# Patient Record
Sex: Male | Born: 1935 | Race: White | Hispanic: No | Marital: Married | Smoking: Former smoker
Health system: Southern US, Community
[De-identification: ages and names within clinical notes are randomized; demographics above are authoritative.]

## PROBLEM LIST (undated history)

## (undated) DIAGNOSIS — I2510 Atherosclerotic heart disease of native coronary artery without angina pectoris: Secondary | ICD-10-CM

## (undated) DIAGNOSIS — I714 Abdominal aortic aneurysm, without rupture, unspecified: Secondary | ICD-10-CM

## (undated) DIAGNOSIS — R002 Palpitations: Secondary | ICD-10-CM

## (undated) DIAGNOSIS — R011 Cardiac murmur, unspecified: Secondary | ICD-10-CM

## (undated) DIAGNOSIS — I509 Heart failure, unspecified: Secondary | ICD-10-CM

## (undated) DIAGNOSIS — IMO0001 Reserved for inherently not codable concepts without codable children: Secondary | ICD-10-CM

## (undated) DIAGNOSIS — R062 Wheezing: Secondary | ICD-10-CM

## (undated) DIAGNOSIS — E785 Hyperlipidemia, unspecified: Secondary | ICD-10-CM

## (undated) DIAGNOSIS — I219 Acute myocardial infarction, unspecified: Secondary | ICD-10-CM

## (undated) DIAGNOSIS — M1990 Unspecified osteoarthritis, unspecified site: Secondary | ICD-10-CM

## (undated) DIAGNOSIS — H539 Unspecified visual disturbance: Secondary | ICD-10-CM

## (undated) DIAGNOSIS — R05 Cough: Secondary | ICD-10-CM

## (undated) DIAGNOSIS — R531 Weakness: Secondary | ICD-10-CM

## (undated) DIAGNOSIS — R1310 Dysphagia, unspecified: Secondary | ICD-10-CM

## (undated) DIAGNOSIS — I480 Paroxysmal atrial fibrillation: Secondary | ICD-10-CM

## (undated) DIAGNOSIS — M7989 Other specified soft tissue disorders: Secondary | ICD-10-CM

## (undated) DIAGNOSIS — Z5189 Encounter for other specified aftercare: Secondary | ICD-10-CM

## (undated) DIAGNOSIS — J189 Pneumonia, unspecified organism: Secondary | ICD-10-CM

## (undated) DIAGNOSIS — K219 Gastro-esophageal reflux disease without esophagitis: Secondary | ICD-10-CM

## (undated) DIAGNOSIS — K529 Noninfective gastroenteritis and colitis, unspecified: Secondary | ICD-10-CM

## (undated) DIAGNOSIS — I951 Orthostatic hypotension: Secondary | ICD-10-CM

## (undated) DIAGNOSIS — I10 Essential (primary) hypertension: Secondary | ICD-10-CM

## (undated) DIAGNOSIS — H3530 Unspecified macular degeneration: Secondary | ICD-10-CM

## (undated) DIAGNOSIS — R059 Cough, unspecified: Secondary | ICD-10-CM

## (undated) DIAGNOSIS — R197 Diarrhea, unspecified: Secondary | ICD-10-CM

## (undated) DIAGNOSIS — R0981 Nasal congestion: Secondary | ICD-10-CM

## (undated) DIAGNOSIS — R001 Bradycardia, unspecified: Secondary | ICD-10-CM

## (undated) DIAGNOSIS — J449 Chronic obstructive pulmonary disease, unspecified: Secondary | ICD-10-CM

## (undated) HISTORY — DX: Reserved for inherently not codable concepts without codable children: IMO0001

## (undated) HISTORY — DX: Dysphagia, unspecified: R13.10

## (undated) HISTORY — DX: Bradycardia, unspecified: R00.1

## (undated) HISTORY — DX: Orthostatic hypotension: I95.1

## (undated) HISTORY — DX: Paroxysmal atrial fibrillation: I48.0

## (undated) HISTORY — DX: Wheezing: R06.2

## (undated) HISTORY — DX: Unspecified visual disturbance: H53.9

## (undated) HISTORY — DX: Acute myocardial infarction, unspecified: I21.9

## (undated) HISTORY — DX: Abdominal aortic aneurysm, without rupture: I71.4

## (undated) HISTORY — DX: Encounter for other specified aftercare: Z51.89

## (undated) HISTORY — DX: Nasal congestion: R09.81

## (undated) HISTORY — DX: Chronic obstructive pulmonary disease, unspecified: J44.9

## (undated) HISTORY — DX: Other specified soft tissue disorders: M79.89

## (undated) HISTORY — DX: Cough: R05

## (undated) HISTORY — DX: Hyperlipidemia, unspecified: E78.5

## (undated) HISTORY — PX: BACK SURGERY: SHX140

## (undated) HISTORY — DX: Essential (primary) hypertension: I10

## (undated) HISTORY — DX: Atherosclerotic heart disease of native coronary artery without angina pectoris: I25.10

## (undated) HISTORY — PX: CATARACT EXTRACTION W/ INTRAOCULAR LENS  IMPLANT, BILATERAL: SHX1307

## (undated) HISTORY — PX: INSERT / REPLACE / REMOVE PACEMAKER: SUR710

## (undated) HISTORY — DX: Palpitations: R00.2

## (undated) HISTORY — DX: Gastro-esophageal reflux disease without esophagitis: K21.9

## (undated) HISTORY — PX: INGUINAL HERNIA REPAIR: SUR1180

## (undated) HISTORY — DX: Heart failure, unspecified: I50.9

## (undated) HISTORY — PX: FIXATION KYPHOPLASTY LUMBAR SPINE: SHX1642

## (undated) HISTORY — DX: Diarrhea, unspecified: R19.7

## (undated) HISTORY — DX: Unspecified macular degeneration: H35.30

## (undated) HISTORY — DX: Unspecified osteoarthritis, unspecified site: M19.90

## (undated) HISTORY — DX: Abdominal aortic aneurysm, without rupture, unspecified: I71.40

## (undated) HISTORY — DX: Cough, unspecified: R05.9

## (undated) HISTORY — DX: Weakness: R53.1

---

## 1987-11-14 DIAGNOSIS — I219 Acute myocardial infarction, unspecified: Secondary | ICD-10-CM

## 1987-11-14 HISTORY — DX: Acute myocardial infarction, unspecified: I21.9

## 1994-11-13 HISTORY — PX: CORONARY ARTERY BYPASS GRAFT: SHX141

## 2000-12-14 HISTORY — PX: PACEMAKER INSERTION: SHX728

## 2004-09-19 ENCOUNTER — Ambulatory Visit: Payer: Self-pay | Admitting: Cardiology

## 2004-10-10 ENCOUNTER — Ambulatory Visit: Payer: Self-pay | Admitting: Internal Medicine

## 2004-10-12 ENCOUNTER — Ambulatory Visit: Payer: Self-pay

## 2004-10-31 ENCOUNTER — Ambulatory Visit: Payer: Self-pay | Admitting: Cardiovascular Disease

## 2004-11-10 ENCOUNTER — Ambulatory Visit: Payer: Self-pay | Admitting: Cardiovascular Disease

## 2004-11-24 ENCOUNTER — Ambulatory Visit: Payer: Self-pay | Admitting: Internal Medicine

## 2004-12-15 ENCOUNTER — Ambulatory Visit: Payer: Self-pay | Admitting: Cardiology

## 2004-12-16 ENCOUNTER — Ambulatory Visit: Payer: Self-pay | Admitting: Internal Medicine

## 2004-12-29 ENCOUNTER — Ambulatory Visit: Payer: Self-pay | Admitting: *Deleted

## 2005-01-17 ENCOUNTER — Ambulatory Visit: Payer: Self-pay | Admitting: Internal Medicine

## 2005-01-26 ENCOUNTER — Ambulatory Visit: Payer: Self-pay | Admitting: Cardiology

## 2005-02-20 ENCOUNTER — Ambulatory Visit: Payer: Self-pay | Admitting: Internal Medicine

## 2005-02-23 ENCOUNTER — Ambulatory Visit: Payer: Self-pay | Admitting: Cardiology

## 2005-03-23 ENCOUNTER — Ambulatory Visit: Payer: Self-pay | Admitting: Internal Medicine

## 2005-03-24 ENCOUNTER — Ambulatory Visit: Payer: Self-pay | Admitting: Internal Medicine

## 2005-04-19 ENCOUNTER — Ambulatory Visit: Payer: Self-pay | Admitting: Cardiology

## 2005-04-28 ENCOUNTER — Ambulatory Visit: Payer: Self-pay | Admitting: Internal Medicine

## 2005-05-19 ENCOUNTER — Ambulatory Visit: Payer: Self-pay | Admitting: Cardiology

## 2005-06-15 ENCOUNTER — Ambulatory Visit: Payer: Self-pay | Admitting: Internal Medicine

## 2005-06-28 ENCOUNTER — Ambulatory Visit: Payer: Self-pay | Admitting: Cardiology

## 2005-06-28 ENCOUNTER — Encounter: Admission: RE | Admit: 2005-06-28 | Discharge: 2005-06-28 | Payer: Self-pay | Admitting: Orthopedic Surgery

## 2005-07-06 ENCOUNTER — Ambulatory Visit: Payer: Self-pay | Admitting: Cardiology

## 2005-07-20 ENCOUNTER — Ambulatory Visit: Payer: Self-pay | Admitting: Cardiology

## 2005-07-23 ENCOUNTER — Emergency Department (HOSPITAL_COMMUNITY): Admission: EM | Admit: 2005-07-23 | Discharge: 2005-07-23 | Payer: Self-pay | Admitting: Emergency Medicine

## 2005-08-03 ENCOUNTER — Ambulatory Visit: Payer: Self-pay | Admitting: Cardiology

## 2005-08-24 ENCOUNTER — Ambulatory Visit: Payer: Self-pay | Admitting: Cardiology

## 2005-09-14 ENCOUNTER — Ambulatory Visit: Payer: Self-pay | Admitting: Cardiology

## 2005-09-28 ENCOUNTER — Ambulatory Visit: Payer: Self-pay | Admitting: Cardiology

## 2005-09-29 ENCOUNTER — Ambulatory Visit: Payer: Self-pay | Admitting: Internal Medicine

## 2005-10-10 ENCOUNTER — Ambulatory Visit: Payer: Self-pay

## 2005-10-13 ENCOUNTER — Ambulatory Visit: Payer: Self-pay | Admitting: Cardiology

## 2005-10-26 ENCOUNTER — Ambulatory Visit: Payer: Self-pay | Admitting: *Deleted

## 2005-11-01 ENCOUNTER — Ambulatory Visit: Payer: Self-pay | Admitting: Internal Medicine

## 2005-11-09 ENCOUNTER — Ambulatory Visit: Payer: Self-pay | Admitting: *Deleted

## 2005-12-05 ENCOUNTER — Ambulatory Visit: Payer: Self-pay | Admitting: Internal Medicine

## 2005-12-07 ENCOUNTER — Ambulatory Visit: Payer: Self-pay | Admitting: Cardiology

## 2005-12-22 ENCOUNTER — Ambulatory Visit: Payer: Self-pay | Admitting: Internal Medicine

## 2006-01-04 ENCOUNTER — Ambulatory Visit: Payer: Self-pay | Admitting: Cardiology

## 2006-01-10 ENCOUNTER — Ambulatory Visit: Payer: Self-pay | Admitting: Internal Medicine

## 2006-01-18 ENCOUNTER — Ambulatory Visit: Payer: Self-pay | Admitting: Cardiology

## 2006-01-31 ENCOUNTER — Ambulatory Visit: Payer: Self-pay | Admitting: Cardiology

## 2006-02-09 ENCOUNTER — Ambulatory Visit: Payer: Self-pay | Admitting: Internal Medicine

## 2006-02-21 ENCOUNTER — Ambulatory Visit: Payer: Self-pay | Admitting: *Deleted

## 2006-03-21 ENCOUNTER — Ambulatory Visit: Payer: Self-pay | Admitting: Cardiology

## 2006-03-21 ENCOUNTER — Ambulatory Visit: Payer: Self-pay | Admitting: Internal Medicine

## 2006-04-13 ENCOUNTER — Ambulatory Visit: Payer: Self-pay | Admitting: Cardiology

## 2006-05-15 ENCOUNTER — Ambulatory Visit: Payer: Self-pay | Admitting: Cardiology

## 2006-05-15 ENCOUNTER — Ambulatory Visit: Payer: Self-pay | Admitting: Internal Medicine

## 2006-05-22 ENCOUNTER — Ambulatory Visit (HOSPITAL_COMMUNITY): Admission: RE | Admit: 2006-05-22 | Discharge: 2006-05-22 | Payer: Self-pay | Admitting: Surgery

## 2006-05-29 ENCOUNTER — Ambulatory Visit: Payer: Self-pay | Admitting: Cardiology

## 2006-06-12 ENCOUNTER — Ambulatory Visit: Payer: Self-pay | Admitting: Cardiology

## 2006-06-26 ENCOUNTER — Ambulatory Visit: Payer: Self-pay | Admitting: Cardiology

## 2006-07-11 ENCOUNTER — Ambulatory Visit: Payer: Self-pay | Admitting: Internal Medicine

## 2006-07-17 ENCOUNTER — Ambulatory Visit: Payer: Self-pay | Admitting: Cardiovascular Disease

## 2006-08-08 ENCOUNTER — Ambulatory Visit: Payer: Self-pay | Admitting: Internal Medicine

## 2006-08-14 ENCOUNTER — Ambulatory Visit: Payer: Self-pay | Admitting: *Deleted

## 2006-08-28 ENCOUNTER — Ambulatory Visit: Payer: Self-pay | Admitting: Cardiology

## 2006-09-10 ENCOUNTER — Ambulatory Visit: Payer: Self-pay | Admitting: Internal Medicine

## 2006-09-18 ENCOUNTER — Ambulatory Visit: Payer: Self-pay | Admitting: *Deleted

## 2006-10-02 ENCOUNTER — Ambulatory Visit: Payer: Self-pay | Admitting: Cardiovascular Disease

## 2006-10-05 ENCOUNTER — Ambulatory Visit: Payer: Self-pay | Admitting: Internal Medicine

## 2006-10-16 ENCOUNTER — Ambulatory Visit: Payer: Self-pay | Admitting: Cardiology

## 2006-10-26 ENCOUNTER — Ambulatory Visit: Payer: Self-pay | Admitting: Internal Medicine

## 2006-10-26 ENCOUNTER — Ambulatory Visit: Payer: Self-pay | Admitting: Cardiology

## 2006-11-09 ENCOUNTER — Ambulatory Visit: Payer: Self-pay | Admitting: Cardiology

## 2006-11-30 ENCOUNTER — Ambulatory Visit: Payer: Self-pay | Admitting: Internal Medicine

## 2006-12-07 ENCOUNTER — Ambulatory Visit: Payer: Self-pay | Admitting: Internal Medicine

## 2006-12-21 ENCOUNTER — Ambulatory Visit: Payer: Self-pay | Admitting: *Deleted

## 2006-12-28 ENCOUNTER — Ambulatory Visit: Payer: Self-pay | Admitting: Internal Medicine

## 2007-01-16 ENCOUNTER — Ambulatory Visit: Payer: Self-pay | Admitting: Critical Care Medicine

## 2007-01-18 ENCOUNTER — Ambulatory Visit: Payer: Self-pay | Admitting: Cardiology

## 2007-01-25 ENCOUNTER — Ambulatory Visit: Payer: Self-pay | Admitting: Internal Medicine

## 2007-01-28 ENCOUNTER — Ambulatory Visit: Payer: Self-pay | Admitting: Internal Medicine

## 2007-02-06 ENCOUNTER — Ambulatory Visit: Payer: Self-pay | Admitting: Cardiovascular Disease

## 2007-02-22 ENCOUNTER — Ambulatory Visit: Payer: Self-pay | Admitting: Internal Medicine

## 2007-03-01 ENCOUNTER — Ambulatory Visit: Payer: Self-pay | Admitting: Cardiology

## 2007-03-05 ENCOUNTER — Ambulatory Visit: Payer: Self-pay | Admitting: Critical Care Medicine

## 2007-03-22 ENCOUNTER — Ambulatory Visit: Payer: Self-pay | Admitting: Internal Medicine

## 2007-03-29 ENCOUNTER — Ambulatory Visit: Payer: Self-pay | Admitting: Cardiology

## 2007-04-12 ENCOUNTER — Ambulatory Visit: Payer: Self-pay | Admitting: Cardiology

## 2007-04-24 ENCOUNTER — Ambulatory Visit: Payer: Self-pay | Admitting: Internal Medicine

## 2007-05-01 ENCOUNTER — Ambulatory Visit: Payer: Self-pay | Admitting: Cardiology

## 2007-05-08 ENCOUNTER — Ambulatory Visit: Payer: Self-pay | Admitting: Cardiology

## 2007-05-24 ENCOUNTER — Ambulatory Visit: Payer: Self-pay | Admitting: Internal Medicine

## 2007-05-29 ENCOUNTER — Ambulatory Visit: Payer: Self-pay | Admitting: Internal Medicine

## 2007-06-19 ENCOUNTER — Ambulatory Visit: Payer: Self-pay | Admitting: Cardiology

## 2007-06-21 ENCOUNTER — Ambulatory Visit: Payer: Self-pay | Admitting: Internal Medicine

## 2007-07-12 ENCOUNTER — Ambulatory Visit: Payer: Self-pay | Admitting: Internal Medicine

## 2007-07-17 ENCOUNTER — Ambulatory Visit: Payer: Self-pay | Admitting: Internal Medicine

## 2007-07-31 ENCOUNTER — Ambulatory Visit: Payer: Self-pay | Admitting: Cardiology

## 2007-08-09 ENCOUNTER — Ambulatory Visit: Payer: Self-pay | Admitting: Internal Medicine

## 2007-08-16 ENCOUNTER — Ambulatory Visit: Payer: Self-pay | Admitting: Internal Medicine

## 2007-09-06 ENCOUNTER — Ambulatory Visit: Payer: Self-pay | Admitting: Internal Medicine

## 2007-09-13 ENCOUNTER — Ambulatory Visit: Payer: Self-pay | Admitting: Cardiology

## 2007-09-27 ENCOUNTER — Ambulatory Visit: Payer: Self-pay | Admitting: Cardiology

## 2007-10-04 ENCOUNTER — Ambulatory Visit: Payer: Self-pay | Admitting: Internal Medicine

## 2007-10-14 ENCOUNTER — Ambulatory Visit: Payer: Self-pay | Admitting: Cardiology

## 2007-10-15 ENCOUNTER — Ambulatory Visit: Payer: Self-pay | Admitting: Cardiology

## 2007-10-31 ENCOUNTER — Ambulatory Visit: Payer: Self-pay | Admitting: Cardiology

## 2007-11-01 ENCOUNTER — Ambulatory Visit: Payer: Self-pay | Admitting: Internal Medicine

## 2007-11-15 ENCOUNTER — Ambulatory Visit: Payer: Self-pay | Admitting: Cardiology

## 2007-11-26 ENCOUNTER — Ambulatory Visit: Payer: Self-pay | Admitting: Cardiovascular Disease

## 2007-11-29 ENCOUNTER — Ambulatory Visit: Payer: Self-pay | Admitting: Internal Medicine

## 2007-12-10 ENCOUNTER — Ambulatory Visit: Payer: Self-pay | Admitting: Cardiovascular Disease

## 2007-12-19 ENCOUNTER — Inpatient Hospital Stay (HOSPITAL_COMMUNITY): Admission: EM | Admit: 2007-12-19 | Discharge: 2007-12-21 | Payer: Self-pay | Admitting: Emergency Medicine

## 2007-12-25 ENCOUNTER — Ambulatory Visit: Payer: Self-pay | Admitting: Cardiology

## 2008-01-06 ENCOUNTER — Ambulatory Visit: Payer: Self-pay | Admitting: Cardiology

## 2008-02-03 ENCOUNTER — Ambulatory Visit: Payer: Self-pay | Admitting: Cardiology

## 2008-02-10 ENCOUNTER — Ambulatory Visit: Payer: Self-pay | Admitting: Cardiology

## 2008-02-25 ENCOUNTER — Ambulatory Visit: Payer: Self-pay | Admitting: Cardiology

## 2008-02-28 ENCOUNTER — Ambulatory Visit: Payer: Self-pay | Admitting: Internal Medicine

## 2008-03-09 ENCOUNTER — Ambulatory Visit: Payer: Self-pay | Admitting: Cardiovascular Disease

## 2008-03-18 ENCOUNTER — Encounter: Admission: RE | Admit: 2008-03-18 | Discharge: 2008-03-18 | Payer: Self-pay | Admitting: Geriatric Medicine

## 2008-03-23 ENCOUNTER — Ambulatory Visit: Payer: Self-pay | Admitting: Cardiology

## 2008-04-14 ENCOUNTER — Ambulatory Visit: Payer: Self-pay | Admitting: Cardiovascular Disease

## 2008-04-15 ENCOUNTER — Ambulatory Visit: Payer: Self-pay | Admitting: Cardiology

## 2008-04-28 ENCOUNTER — Ambulatory Visit: Payer: Self-pay | Admitting: Cardiology

## 2008-05-21 ENCOUNTER — Ambulatory Visit: Payer: Self-pay | Admitting: Cardiology

## 2008-05-25 ENCOUNTER — Telehealth (INDEPENDENT_AMBULATORY_CARE_PROVIDER_SITE_OTHER): Payer: Self-pay | Admitting: *Deleted

## 2008-05-26 ENCOUNTER — Ambulatory Visit: Payer: Self-pay | Admitting: Internal Medicine

## 2008-05-26 DIAGNOSIS — J45909 Unspecified asthma, uncomplicated: Secondary | ICD-10-CM | POA: Insufficient documentation

## 2008-05-26 DIAGNOSIS — J40 Bronchitis, not specified as acute or chronic: Secondary | ICD-10-CM | POA: Insufficient documentation

## 2008-05-27 DIAGNOSIS — E785 Hyperlipidemia, unspecified: Secondary | ICD-10-CM | POA: Insufficient documentation

## 2008-05-27 DIAGNOSIS — I951 Orthostatic hypotension: Secondary | ICD-10-CM

## 2008-05-27 DIAGNOSIS — I4891 Unspecified atrial fibrillation: Secondary | ICD-10-CM

## 2008-05-27 DIAGNOSIS — I2510 Atherosclerotic heart disease of native coronary artery without angina pectoris: Secondary | ICD-10-CM | POA: Insufficient documentation

## 2008-05-29 ENCOUNTER — Ambulatory Visit: Payer: Self-pay | Admitting: Internal Medicine

## 2008-06-03 ENCOUNTER — Ambulatory Visit: Payer: Self-pay | Admitting: Cardiology

## 2008-07-01 ENCOUNTER — Ambulatory Visit: Payer: Self-pay | Admitting: Cardiology

## 2008-07-07 ENCOUNTER — Encounter: Admission: RE | Admit: 2008-07-07 | Discharge: 2008-10-05 | Payer: Self-pay | Admitting: Ophthalmology

## 2008-07-29 ENCOUNTER — Ambulatory Visit: Payer: Self-pay | Admitting: Cardiovascular Disease

## 2008-08-26 ENCOUNTER — Ambulatory Visit: Payer: Self-pay | Admitting: Cardiology

## 2008-08-28 ENCOUNTER — Ambulatory Visit: Payer: Self-pay | Admitting: Internal Medicine

## 2008-09-16 ENCOUNTER — Ambulatory Visit: Payer: Self-pay | Admitting: Cardiovascular Disease

## 2008-09-23 ENCOUNTER — Ambulatory Visit: Payer: Self-pay | Admitting: Internal Medicine

## 2008-10-23 ENCOUNTER — Encounter: Admission: RE | Admit: 2008-10-23 | Discharge: 2008-10-23 | Payer: Self-pay | Admitting: Geriatric Medicine

## 2008-10-28 ENCOUNTER — Ambulatory Visit: Payer: Self-pay

## 2008-10-28 ENCOUNTER — Ambulatory Visit: Payer: Self-pay | Admitting: Cardiology

## 2008-11-18 ENCOUNTER — Encounter: Admission: RE | Admit: 2008-11-18 | Discharge: 2008-11-18 | Payer: Self-pay | Admitting: Geriatric Medicine

## 2008-11-25 ENCOUNTER — Ambulatory Visit: Payer: Self-pay | Admitting: Cardiology

## 2008-11-27 ENCOUNTER — Ambulatory Visit: Payer: Self-pay | Admitting: Internal Medicine

## 2008-12-23 ENCOUNTER — Ambulatory Visit: Payer: Self-pay | Admitting: Cardiology

## 2009-01-22 DIAGNOSIS — L408 Other psoriasis: Secondary | ICD-10-CM

## 2009-01-22 DIAGNOSIS — K219 Gastro-esophageal reflux disease without esophagitis: Secondary | ICD-10-CM

## 2009-01-22 DIAGNOSIS — H3530 Unspecified macular degeneration: Secondary | ICD-10-CM | POA: Insufficient documentation

## 2009-01-22 DIAGNOSIS — Z87898 Personal history of other specified conditions: Secondary | ICD-10-CM | POA: Insufficient documentation

## 2009-02-09 ENCOUNTER — Ambulatory Visit: Payer: Self-pay | Admitting: Cardiology

## 2009-02-15 ENCOUNTER — Encounter: Admission: RE | Admit: 2009-02-15 | Discharge: 2009-02-15 | Payer: Self-pay | Admitting: Geriatric Medicine

## 2009-02-19 ENCOUNTER — Encounter (INDEPENDENT_AMBULATORY_CARE_PROVIDER_SITE_OTHER): Payer: Self-pay

## 2009-02-27 ENCOUNTER — Emergency Department (HOSPITAL_COMMUNITY): Admission: EM | Admit: 2009-02-27 | Discharge: 2009-02-27 | Payer: Self-pay | Admitting: Emergency Medicine

## 2009-03-01 ENCOUNTER — Ambulatory Visit: Payer: Self-pay | Admitting: Internal Medicine

## 2009-03-03 ENCOUNTER — Ambulatory Visit: Payer: Self-pay | Admitting: Internal Medicine

## 2009-03-09 ENCOUNTER — Encounter: Admission: RE | Admit: 2009-03-09 | Discharge: 2009-03-09 | Payer: Self-pay | Admitting: Geriatric Medicine

## 2009-03-09 ENCOUNTER — Ambulatory Visit (HOSPITAL_COMMUNITY): Admission: RE | Admit: 2009-03-09 | Discharge: 2009-03-09 | Payer: Self-pay | Admitting: Geriatric Medicine

## 2009-03-24 ENCOUNTER — Ambulatory Visit: Payer: Self-pay | Admitting: Cardiology

## 2009-04-07 ENCOUNTER — Telehealth (INDEPENDENT_AMBULATORY_CARE_PROVIDER_SITE_OTHER): Payer: Self-pay | Admitting: *Deleted

## 2009-04-07 ENCOUNTER — Ambulatory Visit: Payer: Self-pay | Admitting: Internal Medicine

## 2009-04-13 ENCOUNTER — Telehealth: Payer: Self-pay | Admitting: Cardiology

## 2009-04-14 ENCOUNTER — Encounter: Payer: Self-pay | Admitting: *Deleted

## 2009-04-16 ENCOUNTER — Encounter: Admission: RE | Admit: 2009-04-16 | Discharge: 2009-04-16 | Payer: Self-pay | Admitting: Geriatric Medicine

## 2009-04-19 ENCOUNTER — Ambulatory Visit: Payer: Self-pay | Admitting: Cardiology

## 2009-04-19 DIAGNOSIS — Z950 Presence of cardiac pacemaker: Secondary | ICD-10-CM

## 2009-04-28 ENCOUNTER — Ambulatory Visit: Payer: Self-pay | Admitting: Cardiology

## 2009-04-28 LAB — CONVERTED CEMR LAB: Protime: 15.6

## 2009-05-19 ENCOUNTER — Ambulatory Visit: Payer: Self-pay | Admitting: Internal Medicine

## 2009-05-19 ENCOUNTER — Encounter: Payer: Self-pay | Admitting: *Deleted

## 2009-05-19 ENCOUNTER — Encounter (INDEPENDENT_AMBULATORY_CARE_PROVIDER_SITE_OTHER): Payer: Self-pay | Admitting: Cardiology

## 2009-05-19 LAB — CONVERTED CEMR LAB: POC INR: 1.8

## 2009-05-31 ENCOUNTER — Ambulatory Visit: Payer: Self-pay | Admitting: Internal Medicine

## 2009-06-09 ENCOUNTER — Ambulatory Visit: Payer: Self-pay | Admitting: Cardiology

## 2009-06-09 LAB — CONVERTED CEMR LAB
POC INR: 2.7
Prothrombin Time: 19.8 s

## 2009-06-30 ENCOUNTER — Ambulatory Visit: Payer: Self-pay | Admitting: Cardiology

## 2009-06-30 LAB — CONVERTED CEMR LAB: POC INR: 2.5

## 2009-07-21 ENCOUNTER — Ambulatory Visit: Payer: Self-pay | Admitting: Cardiology

## 2009-07-21 LAB — CONVERTED CEMR LAB: POC INR: 4.4

## 2009-08-03 ENCOUNTER — Telehealth: Payer: Self-pay | Admitting: Cardiology

## 2009-08-04 ENCOUNTER — Ambulatory Visit: Payer: Self-pay | Admitting: Internal Medicine

## 2009-08-04 ENCOUNTER — Encounter (INDEPENDENT_AMBULATORY_CARE_PROVIDER_SITE_OTHER): Payer: Self-pay | Admitting: *Deleted

## 2009-08-04 LAB — CONVERTED CEMR LAB: POC INR: 2.8

## 2009-08-19 ENCOUNTER — Encounter: Payer: Self-pay | Admitting: Cardiology

## 2009-08-23 ENCOUNTER — Encounter: Payer: Self-pay | Admitting: Cardiology

## 2009-08-24 ENCOUNTER — Ambulatory Visit: Payer: Self-pay | Admitting: Cardiology

## 2009-08-24 DIAGNOSIS — R609 Edema, unspecified: Secondary | ICD-10-CM

## 2009-08-26 ENCOUNTER — Ambulatory Visit: Payer: Self-pay | Admitting: Cardiology

## 2009-08-30 ENCOUNTER — Ambulatory Visit: Payer: Self-pay | Admitting: Internal Medicine

## 2009-08-31 ENCOUNTER — Encounter: Payer: Self-pay | Admitting: Cardiology

## 2009-08-31 ENCOUNTER — Ambulatory Visit: Payer: Self-pay | Admitting: Cardiology

## 2009-09-07 ENCOUNTER — Ambulatory Visit (HOSPITAL_COMMUNITY): Admission: RE | Admit: 2009-09-07 | Discharge: 2009-09-07 | Payer: Self-pay | Admitting: Internal Medicine

## 2009-09-07 ENCOUNTER — Encounter: Payer: Self-pay | Admitting: Cardiology

## 2009-09-07 ENCOUNTER — Ambulatory Visit: Payer: Self-pay

## 2009-09-07 ENCOUNTER — Ambulatory Visit: Payer: Self-pay | Admitting: Cardiology

## 2009-09-15 ENCOUNTER — Ambulatory Visit: Payer: Self-pay | Admitting: Cardiology

## 2009-09-15 DIAGNOSIS — M7120 Synovial cyst of popliteal space [Baker], unspecified knee: Secondary | ICD-10-CM

## 2009-09-15 DIAGNOSIS — M79609 Pain in unspecified limb: Secondary | ICD-10-CM

## 2009-09-16 ENCOUNTER — Ambulatory Visit: Payer: Self-pay

## 2009-10-09 ENCOUNTER — Ambulatory Visit: Payer: Self-pay | Admitting: Internal Medicine

## 2009-10-12 ENCOUNTER — Encounter (INDEPENDENT_AMBULATORY_CARE_PROVIDER_SITE_OTHER): Payer: Self-pay | Admitting: Cardiology

## 2009-10-12 ENCOUNTER — Ambulatory Visit: Payer: Self-pay | Admitting: Cardiology

## 2009-10-12 LAB — CONVERTED CEMR LAB: POC INR: 2.6

## 2009-10-22 ENCOUNTER — Ambulatory Visit: Payer: Self-pay | Admitting: Cardiology

## 2009-10-22 ENCOUNTER — Encounter: Payer: Self-pay | Admitting: Internal Medicine

## 2009-11-03 ENCOUNTER — Telehealth (INDEPENDENT_AMBULATORY_CARE_PROVIDER_SITE_OTHER): Payer: Self-pay | Admitting: *Deleted

## 2009-11-09 ENCOUNTER — Ambulatory Visit: Payer: Self-pay | Admitting: Cardiology

## 2009-11-09 LAB — CONVERTED CEMR LAB: POC INR: 3.1

## 2009-11-15 ENCOUNTER — Telehealth: Payer: Self-pay | Admitting: Cardiology

## 2009-11-30 ENCOUNTER — Ambulatory Visit: Payer: Self-pay | Admitting: Cardiology

## 2009-12-14 ENCOUNTER — Ambulatory Visit: Payer: Self-pay | Admitting: Internal Medicine

## 2009-12-31 ENCOUNTER — Ambulatory Visit: Payer: Self-pay | Admitting: Internal Medicine

## 2010-01-04 ENCOUNTER — Encounter (INDEPENDENT_AMBULATORY_CARE_PROVIDER_SITE_OTHER): Payer: Self-pay | Admitting: Cardiology

## 2010-01-04 ENCOUNTER — Ambulatory Visit: Payer: Self-pay | Admitting: Cardiology

## 2010-02-01 ENCOUNTER — Ambulatory Visit: Payer: Self-pay | Admitting: Cardiovascular Disease

## 2010-02-22 ENCOUNTER — Ambulatory Visit: Payer: Self-pay | Admitting: Cardiovascular Disease

## 2010-03-22 ENCOUNTER — Ambulatory Visit: Payer: Self-pay | Admitting: Cardiovascular Disease

## 2010-03-31 ENCOUNTER — Encounter: Payer: Self-pay | Admitting: Cardiology

## 2010-04-01 ENCOUNTER — Ambulatory Visit: Payer: Self-pay | Admitting: Internal Medicine

## 2010-04-15 ENCOUNTER — Ambulatory Visit: Payer: Self-pay | Admitting: Internal Medicine

## 2010-04-15 ENCOUNTER — Encounter: Payer: Self-pay | Admitting: Cardiology

## 2010-04-19 ENCOUNTER — Ambulatory Visit: Payer: Self-pay | Admitting: Cardiology

## 2010-04-19 LAB — CONVERTED CEMR LAB: POC INR: 2.1

## 2010-04-21 ENCOUNTER — Encounter: Admission: RE | Admit: 2010-04-21 | Discharge: 2010-04-21 | Payer: Self-pay | Admitting: Dermatology

## 2010-04-26 ENCOUNTER — Ambulatory Visit: Payer: Self-pay | Admitting: Cardiology

## 2010-05-20 ENCOUNTER — Ambulatory Visit: Payer: Self-pay | Admitting: Cardiology

## 2010-05-20 LAB — CONVERTED CEMR LAB: POC INR: 2.5

## 2010-06-24 ENCOUNTER — Ambulatory Visit: Payer: Self-pay | Admitting: Internal Medicine

## 2010-06-24 LAB — CONVERTED CEMR LAB: POC INR: 3.1

## 2010-07-01 ENCOUNTER — Ambulatory Visit: Payer: Self-pay | Admitting: Internal Medicine

## 2010-07-15 ENCOUNTER — Ambulatory Visit: Payer: Self-pay | Admitting: Cardiology

## 2010-07-15 LAB — CONVERTED CEMR LAB: POC INR: 2.8

## 2010-07-19 ENCOUNTER — Telehealth: Payer: Self-pay | Admitting: Cardiology

## 2010-08-03 ENCOUNTER — Encounter: Admission: RE | Admit: 2010-08-03 | Discharge: 2010-08-03 | Payer: Self-pay | Admitting: Gastroenterology

## 2010-08-05 ENCOUNTER — Ambulatory Visit: Payer: Self-pay | Admitting: Internal Medicine

## 2010-08-05 LAB — CONVERTED CEMR LAB: POC INR: 2.4

## 2010-08-24 ENCOUNTER — Telehealth: Payer: Self-pay | Admitting: Cardiology

## 2010-09-02 ENCOUNTER — Ambulatory Visit: Payer: Self-pay | Admitting: Internal Medicine

## 2010-09-09 ENCOUNTER — Encounter (INDEPENDENT_AMBULATORY_CARE_PROVIDER_SITE_OTHER): Payer: Self-pay | Admitting: *Deleted

## 2010-09-19 ENCOUNTER — Encounter: Payer: Self-pay | Admitting: Internal Medicine

## 2010-09-19 ENCOUNTER — Inpatient Hospital Stay (HOSPITAL_COMMUNITY): Admission: EM | Admit: 2010-09-19 | Discharge: 2010-09-23 | Payer: Self-pay | Admitting: Emergency Medicine

## 2010-09-19 DIAGNOSIS — R6520 Severe sepsis without septic shock: Secondary | ICD-10-CM

## 2010-09-19 DIAGNOSIS — R1319 Other dysphagia: Secondary | ICD-10-CM | POA: Insufficient documentation

## 2010-09-19 DIAGNOSIS — R0902 Hypoxemia: Secondary | ICD-10-CM

## 2010-09-19 DIAGNOSIS — A419 Sepsis, unspecified organism: Secondary | ICD-10-CM | POA: Insufficient documentation

## 2010-09-19 DIAGNOSIS — J189 Pneumonia, unspecified organism: Secondary | ICD-10-CM

## 2010-09-26 ENCOUNTER — Ambulatory Visit: Payer: Self-pay | Admitting: Internal Medicine

## 2010-09-30 ENCOUNTER — Ambulatory Visit: Payer: Self-pay | Admitting: Internal Medicine

## 2010-10-13 ENCOUNTER — Ambulatory Visit: Payer: Self-pay | Admitting: Cardiovascular Disease

## 2010-10-13 ENCOUNTER — Ambulatory Visit: Payer: Self-pay | Admitting: Internal Medicine

## 2010-10-25 ENCOUNTER — Telehealth: Payer: Self-pay | Admitting: Cardiology

## 2010-10-28 ENCOUNTER — Encounter: Payer: Self-pay | Admitting: Cardiology

## 2010-10-28 ENCOUNTER — Ambulatory Visit: Payer: Self-pay | Admitting: Cardiology

## 2010-11-10 ENCOUNTER — Ambulatory Visit: Payer: Self-pay | Admitting: Cardiology

## 2010-11-10 LAB — CONVERTED CEMR LAB: POC INR: 2.3

## 2010-11-13 HISTORY — PX: ABDOMINAL AORTIC ANEURYSM REPAIR: SUR1152

## 2010-11-17 ENCOUNTER — Encounter: Payer: Self-pay | Admitting: Cardiology

## 2010-11-24 ENCOUNTER — Encounter: Payer: Self-pay | Admitting: Cardiology

## 2010-12-04 ENCOUNTER — Encounter: Payer: Self-pay | Admitting: Orthopedic Surgery

## 2010-12-05 ENCOUNTER — Ambulatory Visit (HOSPITAL_COMMUNITY)
Admission: RE | Admit: 2010-12-05 | Discharge: 2010-12-05 | Payer: Self-pay | Source: Home / Self Care | Attending: Gastroenterology | Admitting: Gastroenterology

## 2010-12-09 NOTE — Op Note (Addendum)
NAMEALDRICK, Clayton Gordon                ACCOUNT NO.:  [id number]  MEDICAL RECORD NO.:  [id number]          PATIENT TYPE:  AMB  LOCATION:  ENDO                         FACILITY:  [facility]  PHYSICIAN:  Petra Kuba, M.D.    DATE OF BIRTH:  01-24-1915  DATE OF PROCEDURE:  12/05/2010 DATE OF DISCHARGE:                              OPERATIVE REPORT   PROCEDURE:  Colonoscopy with biopsy.  INDICATION:  Patient with diarrhea and incontinence as well as iron deficiency.  Consent was signed after risks, benefits, methods and options were thoroughly discussed multiple times in the past with both the patient and his wife.  MEDICINES USED:  Fentanyl 50 mcg, Versed 3 mg.  PROCEDURE IN DETAIL:  Rectal inspection was pertinent for minimal external hemorrhoids.  Digital exam was negative.  The pediatric video colonoscope was inserted and with surprising ease advanced around the colon to the cecum.  This did require abdominal pressure one time for looping, but no other abnormalities.  There were no signs of colitis or bleeding on insertion.  A tiny mid ascending polyp was seen and was not biopsied.  The cecum was identified by the appendiceal orifice and the ileocecal valve.  In fact, the scope was inserted a short ways in the terminal ileum, which was normal.  Photo documentation was obtained. The scope was slowly withdrawn.  On slow withdrawal through the colon, a few random biopsies were obtained to rule out microscopic colitis. Other than the tiny polyp in the ascending, which was not biopsied and not worrisome looking, no other abnormalities were seen as we slowly withdrew to the distal sigmoid where another tiny polyp was seen and photo documentation was obtained, but this was not biopsied.  The random colon biopsies were put in the first container.  The prep was fairly adequate.  There was some liquid stool that required washing and suctioning but no gross obvious lesions were seen.  Once back  in the rectum, a large villous-appearing sessile rectal polyp, which seemed to extend right to the anorectal junction was seen.  It was flat. Retroflexion confirmed the findings.  It would be difficult to remove endoscopically based on its position and it was not really affecting the lumen, although it did occupy about a third of the colon.  It still was fairly flat.  We elected to take multiple biopsies, which were done. The scope was reinserted a short ways up the left side of the colon, air was suctioned, scope removed.  The patient tolerated the procedure well. There was no obvious immediate complication.  ENDOSCOPIC DIAGNOSES: 1. Tiny internal-external hemorrhoids. 2. Two tiny sigmoid and ascending polyps, not biopsied. 3. Large villous distal rectal sessile polyp status post biopsy     occupying about a third of the lumen but not particularly     encroaching on it. 4. Otherwise within normal limits to the terminal ileum status post a     few random colon biopsies to rule out microscopic colitis.  PLAN:  We will await pathology, see how he does with his bowels with this very good cleaning and otherwise follow up in  2 weeks to discuss the options on this polyp and call me sooner p.r.n.  We will resume Coumadin tomorrow if no delayed complications.          ______________________________ Petra Kuba, M.D.     MEM/MEDQ  D:  12/05/2010  T:  12/05/2010  Job:  [id number]  cc:   Petra Kuba, M.D. Fax: [phone]  Hal T. Stoneking, M.D. Fax: [phone]  Electronically Signed by Vida Rigger M.D. on 12/09/2010 08:25:21 AM

## 2010-12-11 LAB — CONVERTED CEMR LAB
BUN: 16 mg/dL (ref 6–23)
CO2: 32 meq/L (ref 19–32)
Calcium: 9.1 mg/dL (ref 8.4–10.5)
Chloride: 102 meq/L (ref 96–112)
Creatinine, Ser: 1.2 mg/dL (ref 0.4–1.5)
Glucose, Bld: 98 mg/dL (ref 70–99)

## 2010-12-13 ENCOUNTER — Encounter: Payer: Self-pay | Admitting: Cardiology

## 2010-12-13 ENCOUNTER — Encounter
Admission: RE | Admit: 2010-12-13 | Discharge: 2010-12-13 | Payer: Self-pay | Source: Home / Self Care | Attending: Gastroenterology | Admitting: Gastroenterology

## 2010-12-13 NOTE — Medication Information (Signed)
Summary: rov.km  Anticoagulant Therapy  Managed by: Cloyde Reams, RN, BSN Referring MD: Valera Castle MD PCP: Merlene Laughter, MD Supervising MD: Eden Emms MD, Theron Arista Indication 1: Atrial Fibrillation (ICD-427.31) Indication 2: Coronary Artery Disease (ICD-CAD) Lab Used: [facility] [place] Site: [address] INR POC 1.6 INR RANGE 2 - 2.5  Dietary changes: no    Health status changes: no    Bleeding/hemorrhagic complications: no    Recent/future hospitalizations: no    Any changes in medication regimen? no    Recent/future dental: no  Any missed doses?: no       Is patient compliant with meds? yes       Allergies (verified): No Known Drug Allergies  Anticoagulation Management History:      The patient is taking warfarin and comes in today for a routine follow up visit.  Positive risk factors for bleeding include an age of 75 years or older.  The bleeding index is 'intermediate risk'.  Positive CHADS2 values include Age > 75 years old.  The start date was 11/13/1993.  Anticoagulation responsible provider: Eden Emms MD, Theron Arista.  INR POC: 1.6.  Cuvette Lot#: [id number].  Exp: 03/2011.    Anticoagulation Management Assessment/Plan:      The patient's current anticoagulation dose is Coumadin 5 mg  tabs: take as directed per coumadin clinic.  The target INR is 2 - 2.5.  The next INR is due 02/22/2010.  Anticoagulation instructions were given to patient.  Results were reviewed/authorized by Cloyde Reams, RN, BSN.  He was notified by Cloyde Reams RN.         Prior Anticoagulation Instructions: INR 1.9  Take 1.5 tabs today only, then resume 1 tab daily except 0.5 tab on Mondays, Wednesdays, Fridays.  Recheck in 4 weeks.    Current Anticoagulation Instructions: INR 1.6  Take 1.5 tablets tonight, then start taking 1 tablet daily except 1/2 tablet on Mondays and Fridays.  Recheck in 2-3 weeks.

## 2010-12-13 NOTE — Medication Information (Signed)
Summary: rov/ewj  Anticoagulant Therapy  Managed by: Cloyde Reams, RN, BSN Referring MD: Valera Castle MD PCP: Merlene Laughter, MD Supervising MD: Tenny Craw MD, Gunnar Fusi Indication 1: Atrial Fibrillation (ICD-427.31) Indication 2: Coronary Artery Disease (ICD-CAD) Lab Used: [facility] [place] Site: [address] INR POC 3.1 INR RANGE 2 - 2.5  Dietary changes: yes       Details: Decr amt of vit K intake, has been on vacation.  Health status changes: no    Bleeding/hemorrhagic complications: no    Recent/future hospitalizations: no    Any changes in medication regimen? no    Recent/future dental: no  Any missed doses?: no       Is patient compliant with meds? yes       Allergies: No Known Drug Allergies  Anticoagulation Management History:      The patient is taking warfarin and comes in today for a routine follow up visit.  Positive risk factors for bleeding include an age of 75 years or older.  The bleeding index is 'intermediate risk'.  Positive CHADS2 values include Age > 31 years old.  The start date was 11/13/1993.  Anticoagulation responsible provider: Tenny Craw MD, Gunnar Fusi.  INR POC: 3.1.  Cuvette Lot#: [id number].  Exp: 08/2011.    Anticoagulation Management Assessment/Plan:      The patient's current anticoagulation dose is Coumadin 5 mg  tabs: take as directed per coumadin clinic.  The target INR is 2 - 2.5.  The next INR is due 07/15/2010.  Anticoagulation instructions were given to patient.  Results were reviewed/authorized by Cloyde Reams, RN, BSN.  He was notified by Reina Fuse PharmD.         Prior Anticoagulation Instructions: INR 2.5  Continue on same dosage 1 tablet daily except 1/2 tablet on Wednesdays and Saturdays.  Recheck in 4 weeks.    Current Anticoagulation Instructions: INR 3.1  Take 1/2 tablet today, then start taking 1 tablet daily except 1/2 tablet on Mondays, Wednesdays, and Saturdays.  Recheck in 3 weeks.

## 2010-12-13 NOTE — Cardiovascular Report (Signed)
Summary: Office Visit   Office Visit   Imported By: Roderic Ovens 11/18/2009 14:16:41  _____________________________________________________________________  External Attachment:    Type:   Image     Comment:   External Document

## 2010-12-13 NOTE — Assessment & Plan Note (Signed)
Summary: 6 MONTH ROV/SL   Visit Type:  6 mo f/u Primary Provider:  Merlene Laughter, MD  CC:  sob and pt states he sometimes gets a little chest discomfort at times....denies any edema.  History of Present Illness: Clayton Gordon returns today for evaluation and management of his chronic atrial fibrillation, anticoagulation, and coronary artery disease.  He offers no complaint except that when he pushes himself doing yard work he gets a little short of breath and a little tightness in his chest. It goes away with rest. He also gets a little lightheaded when he stands up too quickly.  He denies any bleeding or melena. Denies orthopnea, PND or increased edema.  Current Medications (verified): 1)  Pravastatin Sodium 20 Mg Tabs (Pravastatin Sodium) .Marland Kitchen.. 1 Once Daily 2)  Proscar 5 Mg  Tabs (Finasteride) .... Take 1 Tablet By Mouth Once A Day 3)  Coumadin 5 Mg  Tabs (Warfarin Sodium) .... Take As Directed Per Coumadin Clinic 4)  Imdur 60 Mg  Xr24h-Tab (Isosorbide Mononitrate) .... Take 1 Tablet By Mouth Once A Day 5)  Coreg 25 Mg  Tabs (Carvedilol) .... Take 1 Tablet By Mouth Two Times A Day 6)  Nitroglycerin 0.4 Mg  Subl (Nitroglycerin) .Marland Kitchen.. 1 Under Tongue Ever 5 Min X3 As Needed For Chest Pain 7)  Mucinex Dm 30-600 Mg  Xr12h-Tab (Dextromethorphan-Guaifenesin) .Marland Kitchen.. 1 Tab Every 12 Hours As Needed 8)  Furosemide 40 Mg Tabs (Furosemide) .Marland Kitchen.. 1 Once Daily 9)  Aciphex 20 Mg Tbec (Rabeprazole Sodium) .Marland Kitchen.. 1 Tab Once Daily 10)  Famotidine 20 Mg Tabs (Famotidine) .Marland Kitchen.. 1 Tab Once Daily 11)  Lomotil 2.5-0.025 Mg Tabs (Diphenoxylate-Atropine) .... As Needed  Allergies (verified): No Known Drug Allergies  Review of Systems       negative history of present illness  Vital Signs:  Patient profile:   77 year old male Height:      69 inches Weight:      145 pounds Pulse rate:   68 / minute Pulse rhythm:   regular BP sitting:   110 / 70  (left arm) Cuff size:   large  Vitals Entered By: Danielle Rankin,  CMA (April 26, 2010 9:56 AM)  Physical Exam  General:  alert and oriented, in no acute distress, elderly Head:  normocephalic and atraumatic Eyes:  PERRLA/EOM intact; conjunctiva and lids normal. Neck:  Neck supple, no JVD. No masses, thyromegaly or abnormal cervical nodes. Chest [name]:  no deformities or breast masses noted Lungs:  Clear bilaterally to auscultation and percussion. Heart:  PMI nondisplaced, irregular rate and rhythm Msk:  decreased ROM.   Pulses:  diminished but present in the lower extremities Extremities:  trace left pedal edema and trace right pedal edema.   Neurologic:  Alert and oriented x 3. Skin:  Intact without lesions or rashes. Psych:  Normal affect.   PPM Specifications Following MD:  Lewayne Bunting, MD     PPM Vendor:  [employer]     PPM Model Number:  [id number]     PPM Serial Number:  [id number] PPM DOI:  01/09/2001     PPM Implanting MD:  Lewayne Bunting, MD  Lead 1    Location: [facility]     DOI: 01/09/2001     Model #: [id number]     Serial #: [id number]     Status: active Lead 2    Location: RV     DOI: 01/09/2001     Model #: [id number]     Serial #: [id number]  Status: active  Magnet Response Rate:  BOL 85 ERI 65  Indications:  AFIB   PPM Follow Up Pacer Dependent:  No      Episodes Coumadin:  Yes  Parameters Mode:  DDIR     Lower Rate Limit:  60     Upper Rate Limit:  120 Paced AV Delay:  180     Impression & Recommendations:  Problem # 1:  ATRIAL FIBRILLATION (ICD-427.31) Assessment Unchanged  His updated medication list for this problem includes:    Coumadin 5 Mg Tabs (Warfarin sodium) .Marland Kitchen... Take as directed per coumadin clinic    Coreg 25 Mg Tabs (Carvedilol) .Marland Kitchen... Take 1 tablet by mouth two times a day  Problem # 2:  COUMADIN THERAPY (ICD-V58.61) Assessment: Unchanged  Problem # 3:  CORONARY ARTERY DISEASE (ICD-414.00) Assessment: Unchanged  His updated medication list for this problem includes:    Coumadin 5 Mg Tabs (Warfarin sodium) .Marland Kitchen... Take  as directed per coumadin clinic    Imdur 60 Mg Xr24h-tab (Isosorbide mononitrate) .Marland Kitchen... Take 1 tablet by mouth once a day    Coreg 25 Mg Tabs (Carvedilol) .Marland Kitchen... Take 1 tablet by mouth two times a day    Nitroglycerin 0.4 Mg Subl (Nitroglycerin) .Marland Kitchen... 1 under tongue ever 5 min x3 as needed for chest pain  Patient Instructions: 1)  Your physician recommends that you schedule a follow-up appointment in: 6 months with Dr. Daleen Squibb 2)  Your physician recommends that you continue on your current medications as directed. Please refer to the Current Medication list given to you today.

## 2010-12-13 NOTE — Medication Information (Signed)
Summary: Clayton Gordon  Anticoagulant Therapy  Managed by: Cloyde Reams, RN, BSN Referring MD: Valera Castle MD PCP: Merlene Laughter, MD Supervising MD: Excell Seltzer MD, Casimiro Needle Indication 1: Atrial Fibrillation (ICD-427.31) Indication 2: Coronary Artery Disease (ICD-CAD) Lab Used: [facility]  Site: [address] INR POC 2.6 INR RANGE 2 - 2.5  Dietary changes: yes       Details: May have eaten sl less greens.    Health status changes: no    Bleeding/hemorrhagic complications: no    Recent/future hospitalizations: no    Any changes in medication regimen? no    Recent/future dental: no  Any missed doses?: no       Is patient compliant with meds? yes      Comments: Pt states he has been taking 5mg  once daily/2.5mg  W.  Allergies (verified): No Known Drug Allergies  Anticoagulation Management History:      The patient is taking warfarin and comes in today for a routine follow up visit.  Positive risk factors for bleeding include an age of 75 years or older.  The bleeding index is 'intermediate risk'.  Positive CHADS2 values include Age > 77 years old.  The start date was 11/13/1993.  Anticoagulation responsible provider: Excell Seltzer MD, Casimiro Needle.  INR POC: 2.6.  Cuvette Lot#: [id number].  Exp: 03/2011.    Anticoagulation Management Assessment/Plan:      The patient's current anticoagulation dose is Coumadin 5 mg  tabs: take as directed per coumadin clinic.  The target INR is 2 - 2.5.  The next INR is due 03/22/2010.  Anticoagulation instructions were given to patient.  Results were reviewed/authorized by Cloyde Reams, RN, BSN.  He was notified by Cloyde Reams RN.         Prior Anticoagulation Instructions: INR 1.6  Take 1.5 tablets tonight, then start taking 1 tablet daily except 1/2 tablet on Mondays and Fridays.  Recheck in 2-3 weeks.     Current Anticoagulation Instructions: INR 2.6  Start taking 5mg  daily except 2.5mg  on Wednesdays and Saturdays.  Recheck in 4 weeks.

## 2010-12-13 NOTE — Medication Information (Signed)
Summary: rov/jaj  Anticoagulant Therapy  Managed by: Lyna Poser, PharmD Referring MD: Valera Castle MD PCP: Merlene Laughter, MD Supervising MD: Tenny Craw MD, Gunnar Fusi Indication 1: Atrial Fibrillation (ICD-427.31) Indication 2: Coronary Artery Disease (ICD-CAD) Lab Used: [facility] [place] Site: [address] INR POC 2.4 INR RANGE 2 - 2.5  Dietary changes: no    Health status changes: no    Bleeding/hemorrhagic complications: no    Recent/future hospitalizations: no    Any changes in medication regimen? no    Recent/future dental: no  Any missed doses?: no       Is patient compliant with meds? yes       Allergies: No Known Drug Allergies  Anticoagulation Management History:      Positive risk factors for bleeding include an age of 24 years or older.  The bleeding index is 'intermediate risk'.  Positive CHADS2 values include Age > 25 years old.  The start date was 11/13/1993.  Anticoagulation responsible provider: Tenny Craw MD, Gunnar Fusi.  INR POC: 2.4.  Exp: 08/2011.    Anticoagulation Management Assessment/Plan:      The patient's current anticoagulation dose is Coumadin 5 mg  tabs: take as directed per coumadin clinic.  The target INR is 2 - 2.5.  The next INR is due 09/02/2010.  Anticoagulation instructions were given to patient.  Results were reviewed/authorized by Lyna Poser, PharmD.         Prior Anticoagulation Instructions: INR 2.8  Take 1/2 tablet today. Then continue with regular schedule of 1 tablet on Sun, Tues, Thurs, and Fri except take 1/2 tablet on Mon, Wed, and Sat.   Re-check INR in 3 weeks.    Current Anticoagulation Instructions: INR 2.4 No changes today. Continue taking 1 tablet on sunday, tuesday, thursday, and friday. Take a half tablet on monday, wednesday, and saturday. Recheck in 4 weeks.

## 2010-12-13 NOTE — Progress Notes (Signed)
Summary: refill request   Phone Note Refill Request Message from:  Patient on August 24, 2010 12:03 PM  Refills Requested: Medication #1:  FUROSEMIDE 40 MG TABS 1 once daily [facility]   Method Requested: Fax to Local Pharmacy Initial call taken by: Glynda Jaeger,  August 24, 2010 12:04 PM Caller: Patient [phone] Reason for Call: Talk to Nurse    Prescriptions: FUROSEMIDE 40 MG TABS (FUROSEMIDE) 1 once daily  #90 x 3   Entered by:   Danielle Rankin, CMA   Authorized by:   Gaylord Shih, MD, [facility]   Signed by:   Danielle Rankin, CMA on 08/24/2010   Method used:   Faxed to ...       [facility] (mail-order)             , [place]         Ph: [phone]       Fax: [phone]   RxID:   [id number]

## 2010-12-13 NOTE — Medication Information (Signed)
Summary: rov/ewj  Anticoagulant Therapy  Managed by: Weston Brass, PharmD Referring MD: Valera Castle MD PCP: Merlene Laughter, MD Supervising MD: Tenny Craw MD, Gunnar Fusi Indication 1: Atrial Fibrillation (ICD-427.31) Indication 2: Coronary Artery Disease (ICD-CAD) Lab Used: [facility] [place] Site: [address] INR POC 2.8 INR RANGE 2 - 2.5  Dietary changes: yes       Details: Less greens  Health status changes: no    Bleeding/hemorrhagic complications: no    Recent/future hospitalizations: no    Any changes in medication regimen? no    Recent/future dental: no  Any missed doses?: no       Is patient compliant with meds? yes       Allergies: No Known Drug Allergies  Anticoagulation Management History:      The patient is taking warfarin and comes in today for a routine follow up visit.  Positive risk factors for bleeding include an age of 75 years or older.  The bleeding index is 'intermediate risk'.  Positive CHADS2 values include Age > 84 years old.  The start date was 11/13/1993.  Anticoagulation responsible provider: Tenny Craw MD, Gunnar Fusi.  INR POC: 2.8.  Cuvette Lot#: [id number].  Exp: 08/2011.    Anticoagulation Management Assessment/Plan:      The patient's current anticoagulation dose is Coumadin 5 mg  tabs: take as directed per coumadin clinic.  The target INR is 2 - 2.5.  The next INR is due 08/05/2010.  Anticoagulation instructions were given to patient.  Results were reviewed/authorized by Weston Brass, PharmD.  He was notified by Harrel Carina, PharmD candidate.         Prior Anticoagulation Instructions: INR 3.1  Take 1/2 tablet today, then start taking 1 tablet daily except 1/2 tablet on Mondays, Wednesdays, and Saturdays.  Recheck in 3 weeks.    Current Anticoagulation Instructions: INR 2.8  Take 1/2 tablet today. Then continue with regular schedule of 1 tablet on Sun, Tues, Thurs, and Fri except take 1/2 tablet on Mon, Wed, and Sat.   Re-check INR in 3  weeks.

## 2010-12-13 NOTE — Medication Information (Signed)
Summary: rov/tm  Anticoagulant Therapy  Managed by: Cloyde Reams, RN, BSN Referring MD: Valera Castle MD PCP: Merlene Laughter, MD Supervising MD: Graciela Husbands MD, Viviann Spare Indication 1: Atrial Fibrillation (ICD-427.31) Indication 2: Coronary Artery Disease (ICD-CAD) Lab Used: [facility] [place] Site: [address] INR POC 1.5 INR RANGE 2 - 2.5  Dietary changes: no    Health status changes: no    Bleeding/hemorrhagic complications: no    Recent/future hospitalizations: no    Any changes in medication regimen? no    Recent/future dental: no  Any missed doses?: no       Is patient compliant with meds? yes       Allergies (verified): No Known Drug Allergies  Anticoagulation Management History:      The patient is taking warfarin and comes in today for a routine follow up visit.  Positive risk factors for bleeding include an age of 45 years or older.  The bleeding index is 'intermediate risk'.  Positive CHADS2 values include Age > 17 years old.  The start date was 11/13/1993.  Anticoagulation responsible provider: Graciela Husbands MD, Viviann Spare.  INR POC: 1.5.  Cuvette Lot#: [id number].  Exp: 02/2011.    Anticoagulation Management Assessment/Plan:      The patient's current anticoagulation dose is Coumadin 5 mg  tabs: take as directed per coumadin clinic.  The target INR is 2 - 2.5.  The next INR is due 12/28/2009.  Anticoagulation instructions were given to patient.  Results were reviewed/authorized by Cloyde Reams, RN, BSN.  He was notified by Cloyde Reams RN.         Prior Anticoagulation Instructions: INR 1.7 Today take 5mg s then resume 2.5mg s everyday except 5mg s on Mondays, Wednesdays and Fridays. Recheck in 2 weeks.   Current Anticoagulation Instructions: INR 1.5  Take 5mg  today then start taking 5mg  daily except 2.5mg  on Mondays, Wednesdays, and Fridays.  Recheck in 2 weeks.

## 2010-12-13 NOTE — Medication Information (Signed)
Summary: rov/mlw  Anticoagulant Therapy  Managed by: Bethena Midget, RN, BSN Referring MD: Valera Castle MD PCP: Merlene Laughter, MD Supervising MD: Myrtis Ser MD, Tinnie Gens Indication 1: Atrial Fibrillation (ICD-427.31) Indication 2: Coronary Artery Disease (ICD-CAD) Lab Used: [facility] [place] Site: [address] INR POC 1.7 INR RANGE 2 - 2.5  Dietary changes: no    Health status changes: no    Bleeding/hemorrhagic complications: no    Recent/future hospitalizations: no    Any changes in medication regimen? no    Recent/future dental: no  Any missed doses?: no       Is patient compliant with meds? yes       Allergies (verified): No Known Drug Allergies  Anticoagulation Management History:      The patient is taking warfarin and comes in today for a routine follow up visit.  Positive risk factors for bleeding include an age of 75 years or older.  The bleeding index is 'intermediate risk'.  Positive CHADS2 values include Age > 93 years old.  The start date was 11/13/1993.  Anticoagulation responsible provider: Myrtis Ser MD, Tinnie Gens.  INR POC: 1.7.  Cuvette Lot#: [id number].  Exp: 02/2011.    Anticoagulation Management Assessment/Plan:      The patient's current anticoagulation dose is Coumadin 5 mg  tabs: take as directed per coumadin clinic.  The target INR is 2 - 2.5.  The next INR is due 12/14/2009.  Anticoagulation instructions were given to patient.  Results were reviewed/authorized by Bethena Midget, RN, BSN.  He was notified by Bethena Midget, RN, BSN.         Prior Anticoagulation Instructions: INR 3.1  Take 0.5 tab daily (2.5 mg) except 1 tab (5 mg) on Mondays, Wednesdays, and Fridays.   Recheck in 3 weeks.   Current Anticoagulation Instructions: INR 1.7 Today take 5mg s then resume 2.5mg s everyday except 5mg s on Mondays, Wednesdays and Fridays. Recheck in 2 weeks.

## 2010-12-13 NOTE — Letter (Signed)
Summary: Handout Printed  Printed Handout:  - Coumadin Instructions-w/out Meds 

## 2010-12-13 NOTE — Cardiovascular Report (Signed)
Summary: TTM   TTM   Imported By: Roderic Ovens 07/14/2010 11:39:01  _____________________________________________________________________  External Attachment:    Type:   Image     Comment:   External Document

## 2010-12-13 NOTE — Medication Information (Signed)
Summary: rov/sp  Anticoagulant Therapy  Managed by: Weston Brass, PharmD Referring MD: Valera Castle MD PCP: Merlene Laughter, MD Supervising MD: Daleen Squibb MD, Maisie Fus Indication 1: Atrial Fibrillation (ICD-427.31) Indication 2: Coronary Artery Disease (ICD-CAD) Lab Used: [facility] [place] Site: [address] INR POC 2.1 INR RANGE 2 - 2.5  Dietary changes: no    Health status changes: no    Bleeding/hemorrhagic complications: no    Recent/future hospitalizations: no    Any changes in medication regimen? no    Recent/future dental: no  Any missed doses?: no       Is patient compliant with meds? yes       Allergies: No Known Drug Allergies  Anticoagulation Management History:      The patient is taking warfarin and comes in today for a routine follow up visit.  Positive risk factors for bleeding include an age of 75 years or older.  The bleeding index is 'intermediate risk'.  Positive CHADS2 values include Age > 49 years old.  The start date was 11/13/1993.  Anticoagulation responsible provider: Daleen Squibb MD, Maisie Fus.  INR POC: 2.1.  Cuvette Lot#: [id number].  Exp: 06/2011.    Anticoagulation Management Assessment/Plan:      The patient's current anticoagulation dose is Coumadin 5 mg  tabs: take as directed per coumadin clinic.  The target INR is 2 - 2.5.  The next INR is due 05/17/2010.  Anticoagulation instructions were given to patient.  Results were reviewed/authorized by Weston Brass, PharmD.  He was notified by Weston Brass PharmD.         Prior Anticoagulation Instructions: INR 2.1  Continue same dose of 1 tablet every day except 1/2 tablet on Wednesday and Saturday   Current Anticoagulation Instructions: INR 2.1  Continue same dose of 1 tablet every day except 1/2 tablet on Wednesday and Saturday

## 2010-12-13 NOTE — Letter (Signed)
Summary: [facility]  [facility]   Imported By: Roderic Ovens 04/21/2010 13:55:28  _____________________________________________________________________  External Attachment:    Type:   Image     Comment:   External Document

## 2010-12-13 NOTE — Medication Information (Signed)
Summary: rov/mw  Anticoagulant Therapy  Managed by: Bethena Midget, RN, BSN Referring MD: Valera Castle MD PCP: Merlene Laughter, PMD, Dr Juanito Doom - Cards, Dr. Ewing Schlein - GI Supervising MD: Eden Emms MD, Theron Arista Indication 1: Atrial Fibrillation (ICD-427.31) Indication 2: Coronary Artery Disease (ICD-CAD) Lab Used: [facility] [place] Site: [address] INR POC 2.2 INR RANGE 2 - 2.5  Dietary changes: no    Health status changes: no    Bleeding/hemorrhagic complications: no    Recent/future hospitalizations: yes       Details: Approx 3 weeks ago was in hospital for pnuemonia  Any changes in medication regimen? no    Recent/future dental: no  Any missed doses?: no       Is patient compliant with meds? yes       Allergies: No Known Drug Allergies  Anticoagulation Management History:      The patient is taking warfarin and comes in today for a routine follow up visit.  Positive risk factors for bleeding include an age of 75 years or older.  The bleeding index is 'intermediate risk'.  Positive CHADS2 values include Age > 37 years old.  The start date was 11/13/1993.  Anticoagulation responsible Shantae Vantol: Eden Emms MD, Theron Arista.  INR POC: 2.2.  Cuvette Lot#: [id number].  Exp: 10/2011.    Anticoagulation Management Assessment/Plan:      The patient's current anticoagulation dose is Coumadin 5 mg  tabs: take as directed per coumadin clinic.  The target INR is 2 - 2.5.  The next INR is due 11/10/2010.  Anticoagulation instructions were given to patient.  Results were reviewed/authorized by Bethena Midget, RN, BSN.  He was notified by Bethena Midget, RN, BSN.         Prior Anticoagulation Instructions: INR 2.4  Continue taking a half tablet on monday, wednesday, and saturday. And 1 tablet all other days. Recheck in 2 weeks.   Current Anticoagulation Instructions: INR 2.2 Continue 1 pill everyday except 1/2 pill on Mondays, Wednesdays and Saturdays. Recheck in 4 weeks.

## 2010-12-13 NOTE — Medication Information (Signed)
Summary: rov/ewj  Anticoagulant Therapy  Managed by: Weston Brass, PharmD Referring MD: Valera Castle MD PCP: Merlene Laughter, MD Supervising MD: Eden Emms MD, Theron Arista Indication 1: Atrial Fibrillation (ICD-427.31) Indication 2: Coronary Artery Disease (ICD-CAD) Lab Used: [facility]  Site: [address] INR POC 2.1 INR RANGE 2 - 2.5  Dietary changes: no    Health status changes: no    Bleeding/hemorrhagic complications: no    Recent/future hospitalizations: no    Any changes in medication regimen? no    Recent/future dental: no  Any missed doses?: no       Is patient compliant with meds? yes       Allergies: No Known Drug Allergies  Anticoagulation Management History:      The patient is taking warfarin and comes in today for a routine follow up visit.  Positive risk factors for bleeding include an age of 12 years or older.  The bleeding index is 'intermediate risk'.  Positive CHADS2 values include Age > 51 years old.  The start date was 11/13/1993.  Anticoagulation responsible provider: Eden Emms MD, Theron Arista.  INR POC: 2.1.  Cuvette Lot#: [id number].  Exp: 06/2011.    Anticoagulation Management Assessment/Plan:      The patient's current anticoagulation dose is Coumadin 5 mg  tabs: take as directed per coumadin clinic.  The target INR is 2 - 2.5.  The next INR is due 04/19/2010.  Anticoagulation instructions were given to patient.  Results were reviewed/authorized by Weston Brass, PharmD.  He was notified by Weston Brass PharmD.         Prior Anticoagulation Instructions: INR 2.6  Start taking 5mg  daily except 2.5mg  on Wednesdays and Saturdays.  Recheck in 4 weeks.    Current Anticoagulation Instructions: INR 2.1  Continue same dose of 1 tablet every day except 1/2 tablet on Wednesday and Saturday

## 2010-12-13 NOTE — Medication Information (Signed)
Summary: rov/sp  Anticoagulant Therapy  Managed by: Lyna Poser, PharmD Referring MD: Valera Castle MD PCP: Merlene Laughter, PMD, Dr Juanito Doom - Cards, Dr. Ewing Schlein - GI Supervising MD: Tenny Craw MD, Gunnar Fusi Indication 1: Atrial Fibrillation (ICD-427.31) Indication 2: Coronary Artery Disease (ICD-CAD) Lab Used: [facility] [place] Site: [address] INR POC 2.4 INR RANGE 2 - 2.5  Dietary changes: no    Health status changes: no    Bleeding/hemorrhagic complications: no    Recent/future hospitalizations: yes       Details: recently hospitallized for pneumonia d/c'd friday. no changes to coumadin dose while in hospital, kept him on same dose PTA.  Any changes in medication regimen? yes       Details: given avelox at d/c for pneumonia. Last dose is tonight.   Recent/future dental: no  Any missed doses?: no       Is patient compliant with meds? yes       Allergies: No Known Drug Allergies  Anticoagulation Management History:      Positive risk factors for bleeding include an age of 75 years or older.  The bleeding index is 'intermediate risk'.  Positive CHADS2 values include Age > 52 years old.  The start date was 11/13/1993.  Anticoagulation responsible provider: Tenny Craw MD, Gunnar Fusi.  INR POC: 2.4.  Exp: 09/2011.    Anticoagulation Management Assessment/Plan:      The patient's current anticoagulation dose is Coumadin 5 mg  tabs: take as directed per coumadin clinic.  The target INR is 2 - 2.5.  The next INR is due 10/14/2010.  Anticoagulation instructions were given to patient.  Results were reviewed/authorized by Lyna Poser, PharmD.         Prior Anticoagulation Instructions: INR 1.9 Today take 6mg  then resume 5mg s everyday except 2.5mg s on Mondays, Wednesdays and Saturdays. Recheck in 4 weeks.  Sample 1mg  Lot # [id number] Exp 12/2010  Current Anticoagulation Instructions: INR 2.4  Continue taking a half tablet on monday, wednesday, and saturday. And 1 tablet all other  days. Recheck in 2 weeks.

## 2010-12-13 NOTE — Progress Notes (Signed)
Summary: call to discuss meds for the year  refills done  Phone Note Call from Patient Call back at Home Phone [phone]   Caller: Patient Reason for Call: Talk to Nurse Summary of Call: Please call patient to discuss medications needs for this year.  He will need assistance with his prescriptions. Initial call taken by: Burnard Leigh,  November 15, 2009 12:43 PM  Follow-up for Phone Call        left message to call back  Nolen Mu pt wants refills of all medication, send to [facility] #90 X3  reviewed medications with patients wife and #90 with 3 refills sent into [facility].  (for imdur and furosemide) no other refills needed per history. Follow-up by: Charolotte Capuchin, RN,  November 15, 2009 4:15 PM    Prescriptions: FUROSEMIDE 40 MG TABS (FUROSEMIDE) 1 once daily  #90 x 3   Entered by:   Charolotte Capuchin, RN   Authorized by:   Gaylord Shih, MD, [facility]   Signed by:   Charolotte Capuchin, RN on 11/15/2009   Method used:   Faxed to ...       [facility] (mail-order)             , [place]         Ph: [phone]       Fax: [phone]   RxID:   [id number] IMDUR 60 MG  XR24H-TAB (ISOSORBIDE MONONITRATE) Take 1 tablet by mouth once a day  #90 x 3   Entered by:   Charolotte Capuchin, RN   Authorized by:   Gaylord Shih, MD, [facility]   Signed by:   Charolotte Capuchin, RN on 11/15/2009   Method used:   Faxed to ...       [facility] (mail-order)             , [place]         Ph: [phone]       Fax: [phone]   RxID:   [id number]

## 2010-12-13 NOTE — Cardiovascular Report (Signed)
Summary: TTM   TTM   Imported By: Roderic Ovens 10/21/2010 11:26:22  _____________________________________________________________________  External Attachment:    Type:   Image     Comment:   External Document

## 2010-12-13 NOTE — Miscellaneous (Signed)
Summary: dx correction  Clinical Lists Changes  Problems: Changed problem from PACEMAKER (ICD-V45..01) to PACEMAKER, PERMANENT (ICD-V45.01)  changed the incorrect dx code to correct dx code 

## 2010-12-13 NOTE — Procedures (Signed)
Summary: pacer check/medtronic   Current Medications (verified): 1)  Pravastatin Sodium 20 Mg Tabs (Pravastatin Sodium) .Marland Kitchen.. 1 Once Daily 2)  Coumadin 5 Mg  Tabs (Warfarin Sodium) .... Take As Directed Per Coumadin Clinic 3)  Imdur 60 Mg  Xr24h-Tab (Isosorbide Mononitrate) .... Take 1 Tablet By Mouth Once A Day 4)  Coreg 25 Mg  Tabs (Carvedilol) .... Take 1 Tablet By Mouth Two Times A Day 5)  Nitroglycerin 0.4 Mg  Subl (Nitroglycerin) .Marland Kitchen.. 1 Under Tongue Ever 5 Min X3 As Needed For Chest Pain 6)  Mucinex Dm 30-600 Mg  Xr12h-Tab (Dextromethorphan-Guaifenesin) .Marland Kitchen.. 1 Tab Every 12 Hours As Needed 7)  Furosemide 40 Mg Tabs (Furosemide) .Marland Kitchen.. 1 Once Daily 8)  Aciphex 20 Mg Tbec (Rabeprazole Sodium) .Marland Kitchen.. 1 Tab Once Daily 9)  Famotidine 20 Mg Tabs (Famotidine) .Marland Kitchen.. 1 Tab Once Daily 10)  Lomotil 2.5-0.025 Mg Tabs (Diphenoxylate-Atropine) .... As Needed  Allergies (verified): No Known Drug Allergies   PPM Specifications Following MD:  Lewayne Bunting, MD     PPM Vendor:  Medtronic     PPM Model Number:  [id number]     PPM Serial Number:  [id number] PPM DOI:  01/09/2001     PPM Implanting MD:  Lewayne Bunting, MD  Lead 1    Location: RA     DOI: 01/09/2001     Model #: [id number]     Serial #: [id number]     Status: active Lead 2    Location: RV     DOI: 01/09/2001     Model #: [id number]     Serial #: [id number]     Status: active  Magnet Response Rate:  BOL 85 ERI 65  Indications:  AFIB   PPM Follow Up Battery Voltage:  2.74 V     Battery Est. Longevity:  25 MTHS     Pacer Dependent:  No       PPM Device Measurements Atrium  Amplitude: 2.00 mV, Impedance: 862 ohms,  Right Ventricle  Amplitude: 11.20 mV, Impedance: 824 ohms, Threshold: 1.00 V at 0.40 msec  Episodes MS Episodes:  500     Percent Mode Switch:  99.9%     Coumadin:  Yes Ventricular High Rate:  0     Atrial Pacing:  45.5%     Ventricular Pacing:  84.7%  Parameters Mode:  DDIR     Lower Rate Limit:  60     Upper Rate Limit:  120 Paced AV  Delay:  180     Next Cardiology Appt Due:  04/14/2011 Tech Comments:  PT IN AF 99.9% OF TIME. + COUMADIN.  NORMAL DEVICE FUNCTION.  NO CHANGES MADE. ROV IN 6 MTHS W/GT. Vella Kohler  October 13, 2010 10:15 AM

## 2010-12-13 NOTE — Assessment & Plan Note (Signed)
Summary: severe spesis, pna   Visit Type:  ADMISSION HISTORY AND PHYSICAL Copy to:  Dr Sunnie Nielsen - ER Primary Provider/Referring Provider:  Merlene Laughter, PMD, Dr Juanito Doom - Cards, Dr. Ewing Schlein - GI  CC:  Dr Merlene Laughter.  History of Present Illness: September 19, 6054    77 year old very functional male. with with diastolic CHF, atrial fibrillation and bradycardia and is s/p PPM followed by Dr. Elam Dutch. EX-smoker since 40. Hx of  moderate persistent asthma and associated bronchitis - ? stil compliant with advair. Last seen by Dr. Delford Field in 2008. BAseline PFTs in 2008 of  FEV1 of 84% predicted, FVC of 91% predicted, FEF 25/75 of 27% predicted.  Total lung capacity 111% predicted.  Diffusion capacity 104% predicted.   c/o baseline dysphagia for liquids > solids for several years and periodically has hx of aspiration although clinically never manifested. Had some wine last night - unclear if he aspirated. OVernight since midnight developed fever, cough, white sputum, chills and left precordial chest pain. Therefore, came to ER. Denies sick cotnacts. No change in chronic diarrhea.  In ER at [facility], had SIRS with fever (WC 18.7K, R 25), LUL/Lingula Pneumonia. Developed hypotension and resusictated to normal BP with 1-2L fluids. PCCM called to admit.      Preventive Screening-Counseling & Management  Alcohol-Tobacco     Smoking Status: quit > 6 months     Year Quit: 1983     Pack years: 24yrs, 1ppd  Allergies: No Known Drug Allergies  Past History:  Past Surgical History: Last updated: 01/22/2009 CABG 1996 PPM 2/02 Medtronic Sigma Right inguinal hernia repair 7.10.07  Family History: Last updated: 11/26/2008 non-contributory   Social History: Last updated: 01/22/2009 No tobacco in 20 years Married  Tobacco Use - No.  Alcohol Use - yes social drinker Drug Use - no  Risk Factors: Smoking Status: quit > 6 months (09/19/2010)  Past Medical History: BRONCHITIS  (ICD-490) ASTHMA (ICD-493.90)  Coronary disease Atrial fibrillation paroxysmal  - s/p pacer Bradycardia orthostatic hypotension Dysphagia NOS  - hx sounds like achalasia  - recollects woirkup with Dr. Ewing Schlein  - chropnic. Liquid dysphagia worse than solids  Social History: Smoking Status:  quit > 6 months  Review of Systems       The patient complains of fever, chest pain, dyspnea on exertion, prolonged cough, and severe indigestion/heartburn.  The patient denies anorexia, weight loss, weight gain, vision loss, decreased hearing, hoarseness, syncope, peripheral edema, headaches, hemoptysis, abdominal pain, melena, hematochezia, hematuria, incontinence, genital sores, muscle weakness, suspicious skin lesions, transient blindness, difficulty walking, depression, unusual weight change, abnormal bleeding, enlarged lymph nodes, angioedema, breast masses, and testicular masses.    Vital Signs:  Patient profile:   77 year old male O2 Sat:      98 % on 3 L/min Temp:     102 degrees F Pulse rate:   72 / minute Pulse rhythm:   irregularly irregular Resp:     23 per minute BP sitting:   87 / 53  O2 Flow:  3 L/min  Physical Exam  General:  alert and oriented, in no acute distress, elderly Looks younger than stated age In ER Bed 6  - no distress Head:  normocephalic and atraumatic Eyes:  PERRLA/EOM intact; conjunctiva and lids normal. Ears:  TMs intact and clear with normal canals Nose:  no deformity, discharge, inflammation, or lesions Mouth:  no deformity or lesions Neck:  Neck supple, no JVD. No masses, thyromegaly  or abnormal cervical nodes. Chest Wall:  no deformities or breast masses noted Lungs:  coarse bilaterally crackles in lingula full sentences RR 23 no acc muscle use not paradoxical No cyanosis Heart:  PMI nondisplaced, irregular rate and rhythm Abdomen:  Bowel sounds positive; abdomen soft and non-tender without masses, organomegaly, or hernias noted. No  hepatosplenomegaly. Msk:  decreased ROM.   Pulses:  diminished but present in the lower extremities Extremities:  trace left pedal edema and trace right pedal edema.   Neurologic:  Alert and oriented x 3. Skin:  Intact without lesions or rashes. Psych:  Normal affect.   MISC. Report  Procedure date:  09/19/2010  Findings:       WBC                                      18.7       h      4.0-10.5         K/uL  RBC                                      3.89       l      4.22-5.81        MIL/uL  Hemoglobin (HGB)                         12.7       l      13.0-17.0        g/dL  Hematocrit (HCT)                         38.5       l      39.0-52.0        %  MCV                                      99.0              78.0-100.0       fL  MCH -                                    32.6              26.0-34.0        pg  MCHC                                     33.0              30.0-36.0        g/dL  RDW                                      14.4              11.5-15.5        %  Platelet Count (PLT)  141        l      150-400          K/uL  MISC. Report  Procedure date:  09/19/2010  Findings:      creat 1.26 INR 2.55  CXR  Procedure date:  09/19/2010  Findings:       PORTABLE CHEST - 1 VIEW 09/19/2010 0748 hours:    Comparison: Two-view chest x-ray 04/16/2009 and 05/22/2006.    Findings: Prior sternotomy for CABG.  Cardiac silhouette normal in   size, unchanged.  Pulmonary venous hypertension without overt   edema.  Focal airspace consolidation in the left upper lobe,   partially obscured by the battery generator pack for the left   subclavian dual lead transvenous pacemaker.  No visible pleural   effusions.    IMPRESSION:   Left upper lobe pneumonia.    Read By:  Arnell Sieving,  M.D.   Released By:  Arnell Sieving,  M.D  Impression & Recommendations:  Problem # 1:  PNEUMONIA, ORGAN UNSPECIFIED (ICD-486) Assessment New  Aspiration versuw  CAP  PLAN Avelox CEftriaxone Full code Monitor closely swallow evaluation  Orders: No Charge Patient Arrived (NCPA0) (NCPA0)  Problem # 2:  SEVERE SEPSIS (ICD-995.92) Assessment: New  Had hypotension that responded to fluids in ER. Has occult septic shock due to pneumonia  PLAN Hydrate 2L fluid boluse stat IF hypotensive again or lactate >4, place CVL Copde sepsis called from time of admit to ER Monitor closely  Orders: No Charge Patient Arrived (NCPA0) (NCPA0)  Problem # 3:  HYPOXEMIA (ICD-799.02) Assessment: New  Due to pneumonia. Currently on 3L [place]  PLAN check abg Bipap or intubate if worsenes.  He is at high risk for ARDS. Given normal functional status he desires to be full code  Orders: No Charge Patient Arrived (NCPA0) (NCPA0)  Problem # 4:  CORONARY ARTERY DISEASE (ICD-414.00) Assessment: Unchanged will rule out mi Hold coreg and lasix and imdur due to hypotension  Problem # 5:  ASTHMA (ICD-493.90) Assessment: Unchanged curerently not in exacerbation  plan Rx with BD nebs IF wheezing increases then start systemic  steroiuds- till then hold off check random cortisol  Problem # 6:  OTHER DYSPHAGIA (ICD-787.29) Assessment: New  plan npo except meds speech evaluation get records from Dr. Ewing Schlein if needed  Orders: No Charge Patient Arrived (NCPA0) (NCPA0)

## 2010-12-13 NOTE — Cardiovascular Report (Signed)
Summary: TTM   TTM   Imported By: Roderic Ovens 01/13/2010 16:11:17  _____________________________________________________________________  External Attachment:    Type:   Image     Comment:   External Document

## 2010-12-13 NOTE — Cardiovascular Report (Signed)
Summary: Transtelephonic Pacemaker Monitoring Report  Transtelephonic Pacemaker Monitoring Report   Imported By: Debby Freiberg 05/11/2010 14:10:24  _____________________________________________________________________  External Attachment:    Type:   Image     Comment:   External Document

## 2010-12-13 NOTE — Letter (Signed)
Summary: [employer] - Pravachol  [employer] - Pravachol   Imported By: Roderic Ovens 12/02/2009 14:26:18  _____________________________________________________________________  External Attachment:    Type:   Image     Comment:   External Document

## 2010-12-13 NOTE — Assessment & Plan Note (Signed)
Summary: PC2 MEDTR[employer]IC CHECK   Visit Type:  Follow-up Primary Provider:  Merlene Laughter, MD   History of Present Illness: [name] returns today for followup of his PPM and atrial fibrillation.  He has a h/o Diastolic CHF, atrial fibrillation and bradycardia and is s/p PPM.  He exercises 6 times a week and has had minimal peripheral edema.  No c/p.  Current Medications (verified): 1)  Pravastatin Sodium 20 Mg Tabs (Pravastatin Sodium) .Marland Kitchen.. 1 Once Daily 2)  Proscar 5 Mg  Tabs (Finasteride) .... Take 1 Tablet By Mouth Once A Day 3)  Coumadin 5 Mg  Tabs (Warfarin Sodium) .... Take As Directed Per Coumadin Clinic 4)  Imdur 60 Mg  Xr24h-Tab (Isosorbide Mononitrate) .... Take 1 Tablet By Mouth Once A Day 5)  Coreg 25 Mg  Tabs (Carvedilol) .... Take 1 Tablet By Mouth Two Times A Day 6)  Nitroglycerin 0.4 Mg  Subl (Nitroglycerin) .Marland Kitchen.. 1 Under Tongue Ever 5 Min X3 As Needed For Chest Pain 7)  Mucinex Dm 30-600 Mg  Xr12h-Tab (Dextromethorphan-Guaifenesin) .Marland Kitchen.. 1 Tab Every 12 Hours As Needed 8)  Furosemide 40 Mg Tabs (Furosemide) .Marland Kitchen.. 1 Once Daily 9)  Aciphex 20 Mg Tbec (Rabeprazole Sodium) .Marland Kitchen.. 1 Tab Once Daily 10)  Famotidine 20 Mg Tabs (Famotidine) .Marland Kitchen.. 1 Tab Once Daily  Allergies (verified): No Known Drug Allergies  Past History:  Past Medical History: Last updated: 01/22/2009 BRONCHITIS (ICD-490) ASTHMA (ICD-493.90)  Coronary disease Atrial fibrillation paroxysmal Bradycardia orthostatic hypotension  Past Surgical History: Last updated: 01/22/2009 CABG 1996 PPM 2/02 Medtronic Sigma Right inguinal hernia repair 7.10.07  Review of Systems  The patient denies chest pain, syncope, dyspnea on exertion, and peripheral edema.    Vital Signs:  Patient profile:   77 year old male Height:      69 inches Weight:      145 pounds BMI:     21.49 Pulse rate:   66 / minute BP sitting:   140 / 80  (left arm)  Vitals Entered By: Laurance Flatten CMA (April 15, 2010 11:35 AM)  Physical  Exam  General:  elderly Head:  normocephalic and atraumatic Eyes:  PERRLA/EOM intact; conjunctiva and lids normal. Neck:  Neck supple, no JVD. No masses, thyromegaly or abnormal cervical nodes. Lungs:  Clear bilaterally to auscultation and percussion. Heart:  irregular rate and rhythm, PMI nondisplaced, no gallop Abdomen:  Bowel sounds positive; abdomen soft and non-tender without masses, organomegaly, or hernias noted. No hepatosplenomegaly. Msk:  decreased ROM.   Pulses:  pulses normal in all 4 extremities Extremities:  trace peripheral edema. Neurologic:  Alert and oriented x 3.   PPM Specifications Following MD:  Lewayne Bunting, MD     PPM Vendor:  Medtronic     PPM Model Number:  [id number]     PPM Serial Number:  [id number] PPM DOI:  01/09/2001     PPM Implanting MD:  Lewayne Bunting, MD  Lead 1    Location: RA     DOI: 01/09/2001     Model #: [id number]     Serial #: [id number]     Status: active Lead 2    Location: RV     DOI: 01/09/2001     Model #: [id number]     Serial #: [id number]     Status: active  Magnet Response Rate:  BOL 85 ERI 65  Indications:  AFIB   PPM Follow Up Battery Voltage:  2.75 V     Battery Est. Longevity:      Pacer Dependent:  No       PPM Device Measurements Atrium  Amplitude: 2.00 mV, Impedance: 872 ohms,  Right Ventricle  Amplitude: 11.20 mV, Impedance: 905 ohms, Threshold: 1.00 V at 0.40 msec  Episodes MS Episodes:  0     Percent Mode Switch:  0     Coumadin:  Yes Ventricular High Rate:  0     Atrial Pacing:  48.1%     Ventricular Pacing:  87.5%  Parameters Mode:  DDIR     Lower Rate Limit:  60     Upper Rate Limit:  120 Paced AV Delay:  180     Tech Comments:  PT IN AF. +COUMADIN.  NORMAL SENSING AND THRESHOLD TESTING. BATTERY LONGEVITY 29 MTHS.  NO CHANGES MADE.  Vella Kohler  April 15, 2010 11:37 AM MD Comments:  Agree with above.  Impression & Recommendations:  Problem # 1:  PACEMAKER (ICD-V45.Marland Kitchen01) His device is working normally with  approximately 2 yrs of battery longevity.  Will recheck in one year.  Problem # 2:  HYPERLIPIDEMIA (ICD-272.4) He will continue his current meds and maintain a low sodium diet. His updated medication list for this problem includes:    Pravastatin Sodium 20 Mg Tabs (Pravastatin sodium) .Marland Kitchen... 1 once daily  Patient Instructions: 1)  Your physician recommends that you schedule a follow-up appointment in: 12 months with Dr Ladona Ridgel

## 2010-12-13 NOTE — Medication Information (Signed)
Summary: rov/mw  Anticoagulant Therapy  Managed by: Bethena Midget, RN, BSN Referring MD: Valera Castle MD PCP: Merlene Laughter, MD Supervising MD: Tenny Craw MD, Gunnar Fusi Indication 1: Atrial Fibrillation (ICD-427.31) Indication 2: Coronary Artery Disease (ICD-CAD) Lab Used: [facility] [place] Site: [address] INR POC 1.9 INR RANGE 2 - 2.5  Dietary changes: no    Health status changes: no    Bleeding/hemorrhagic complications: no    Recent/future hospitalizations: no    Any changes in medication regimen? no    Recent/future dental: no  Any missed doses?: no       Is patient compliant with meds? yes       Allergies: No Known Drug Allergies  Anticoagulation Management History:      The patient is taking warfarin and comes in today for a routine follow up visit.  Positive risk factors for bleeding include an age of 19 years or older.  The bleeding index is 'intermediate risk'.  Positive CHADS2 values include Age > 35 years old.  The start date was 11/13/1993.  Anticoagulation responsible provider: Tenny Craw MD, Gunnar Fusi.  INR POC: 1.9.  Cuvette Lot#: [id number].  Exp: 09/2011.    Anticoagulation Management Assessment/Plan:      The patient's current anticoagulation dose is Coumadin 5 mg  tabs: take as directed per coumadin clinic.  The target INR is 2 - 2.5.  The next INR is due 09/30/2010.  Anticoagulation instructions were given to patient.  Results were reviewed/authorized by Bethena Midget, RN, BSN.  He was notified by Bethena Midget, RN, BSN.         Prior Anticoagulation Instructions: INR 2.4 No changes today. Continue taking 1 tablet on sunday, tuesday, thursday, and friday. Take a half tablet on monday, wednesday, and saturday. Recheck in 4 weeks.   Current Anticoagulation Instructions: INR 1.9 Today take 6mg  then resume 5mg s everyday except 2.5mg s on Mondays, Wednesdays and Saturdays. Recheck in 4 weeks.  Sample 1mg  Lot # [id number] Exp 12/2010

## 2010-12-13 NOTE — Cardiovascular Report (Signed)
Summary: Office Visit   [facility]   Imported By: Roderic Ovens 04/26/2010 10:56:21  _____________________________________________________________________  External Attachment:    Type:   Image     Comment:   External Document

## 2010-12-13 NOTE — Medication Information (Signed)
Summary: rov/sp  Anticoagulant Therapy  Managed by: Cloyde Reams, RN, BSN Referring MD: Valera Castle MD PCP: Merlene Laughter, MD Supervising MD: Antoine Poche MD, Fayrene Fearing Indication 1: Atrial Fibrillation (ICD-427.31) Indication 2: Coronary Artery Disease (ICD-CAD) Lab Used: [facility] [place] Site: [address] INR POC 2.5 INR RANGE 2 - 2.5  Dietary changes: no    Health status changes: no    Bleeding/hemorrhagic complications: no    Recent/future hospitalizations: no    Any changes in medication regimen? no    Recent/future dental: no  Any missed doses?: no       Is patient compliant with meds? yes       Allergies: No Known Drug Allergies  Anticoagulation Management History:      The patient is taking warfarin and comes in today for a routine follow up visit.  Positive risk factors for bleeding include an age of 75 years or older.  The bleeding index is 'intermediate risk'.  Positive CHADS2 values include Age > 29 years old.  The start date was 11/13/1993.  Anticoagulation responsible provider: Antoine Poche MD, Fayrene Fearing.  INR POC: 2.5.  Cuvette Lot#: [id number].  Exp: 07/2011.    Anticoagulation Management Assessment/Plan:      The patient's current anticoagulation dose is Coumadin 5 mg  tabs: take as directed per coumadin clinic.  The target INR is 2 - 2.5.  The next INR is due 06/17/2010.  Anticoagulation instructions were given to patient.  Results were reviewed/authorized by Cloyde Reams, RN, BSN.  He was notified by Cloyde Reams RN.         Prior Anticoagulation Instructions: INR 2.1  Continue same dose of 1 tablet every day except 1/2 tablet on Wednesday and Saturday  Current Anticoagulation Instructions: INR 2.5  Continue on same dosage 1 tablet daily except 1/2 tablet on Wednesdays and Saturdays.  Recheck in 4 weeks.

## 2010-12-13 NOTE — Progress Notes (Signed)
Summary: REFILLS SENT TO [facility], NTG, PRAVASTATIN  Phone Note Refill Request Message from:  Patient on July 19, 2010 9:22 AM  Refills Requested: Medication #1:  NITROGLYCERIN 0.4 MG  SUBL 1 under tongue ever 5 min x3 as needed for chest pain pravical also at [facility] delivery   Method Requested: Telephone to Pharmacy Initial call taken by: Glynda Jaeger,  July 19, 2010 9:22 AM    Prescriptions: NITROGLYCERIN 0.4 MG  SUBL (NITROGLYCERIN) 1 under tongue ever 5 min x3 as needed for chest pain  #25 x 9   Entered by:   Danielle Rankin, CMA   Authorized by:   Gaylord Shih, MD, [facility]   Signed by:   Danielle Rankin, CMA on 07/19/2010   Method used:   Faxed to ...       [facility] (mail-order)             , [place]         Ph: [phone]       Fax: [phone]   RxID:   [id number] PRAVASTATIN SODIUM 20 MG TABS (PRAVASTATIN SODIUM) 1 once daily  #90 x 3   Entered by:   Danielle Rankin, CMA   Authorized by:   Gaylord Shih, MD, [facility]   Signed by:   Danielle Rankin, CMA on 07/19/2010   Method used:   Faxed to ...       [facility] (mail-order)             , [place]         Ph: [phone]       Fax: [phone]   RxID:   [id number]

## 2010-12-13 NOTE — Medication Information (Signed)
Summary: rov/ewj  Anticoagulant Therapy  Managed by: Shelby Dubin, PharmD, BCPS, CPP Referring MD: Valera Castle MD PCP: Merlene Laughter, MD Supervising MD: Daleen Squibb MD, Maisie Fus Indication 1: Atrial Fibrillation (ICD-427.31) Indication 2: Coronary Artery Disease (ICD-CAD) Lab Used: [facility] [place] Site: [address] INR POC 1.9 INR RANGE 2 - 2.5  Dietary changes: no    Health status changes: no    Bleeding/hemorrhagic complications: no    Recent/future hospitalizations: no    Any changes in medication regimen? no    Recent/future dental: no  Any missed doses?: no       Is patient compliant with meds? yes       Allergies: No Known Drug Allergies  Anticoagulation Management History:      The patient is taking warfarin and comes in today for a routine follow up visit.  Positive risk factors for bleeding include an age of 75 years or older.  The bleeding index is 'intermediate risk'.  Positive CHADS2 values include Age > 77 years old.  The start date was 11/13/1993.  Anticoagulation responsible provider: Daleen Squibb MD, Maisie Fus.  INR POC: 1.9.  Cuvette Lot#: [id number].  Exp: 02/2011.    Anticoagulation Management Assessment/Plan:      The patient's current anticoagulation dose is Coumadin 5 mg  tabs: take as directed per coumadin clinic.  The target INR is 2 - 2.5.  The next INR is due 02/01/2010.  Anticoagulation instructions were given to patient.  Results were reviewed/authorized by Shelby Dubin, PharmD, BCPS, CPP.  He was notified by Shelby Dubin PharmD, BCPS, CPP.         Prior Anticoagulation Instructions: INR 1.5  Take 5mg  today then start taking 5mg  daily except 2.5mg  on Mondays, Wednesdays, and Fridays.  Recheck in 2 weeks.     Current Anticoagulation Instructions: INR 1.9  Take 1.5 tabs today only, then resume 1 tab daily except 0.5 tab on Mondays, Wednesdays, Fridays.  Recheck in 4 weeks.

## 2010-12-15 ENCOUNTER — Ambulatory Visit: Admit: 2010-12-15 | Payer: Self-pay

## 2010-12-15 ENCOUNTER — Encounter (INDEPENDENT_AMBULATORY_CARE_PROVIDER_SITE_OTHER): Payer: Medicare Other

## 2010-12-15 ENCOUNTER — Encounter: Payer: Self-pay | Admitting: Cardiology

## 2010-12-15 DIAGNOSIS — Z7901 Long term (current) use of anticoagulants: Secondary | ICD-10-CM

## 2010-12-15 DIAGNOSIS — I4891 Unspecified atrial fibrillation: Secondary | ICD-10-CM

## 2010-12-15 NOTE — Progress Notes (Signed)
Summary: QUESTION ON MEDS  Phone Note Call from Patient Call back at Home Phone [phone]   Caller: Patient Reason for Call: Talk to Nurse Summary of Call: PT HAS QUESTION ON MEDS. PT NEEDS REFILL AS WELL. Initial call taken by: Roe Coombs,  October 25, 2010 11:39 AM  Follow-up for Phone Call        pt has an appointment on Friday and will bring his medication bottles with him so we can look and see what he needs to have sent into [facility] for a 90 day supply.   Dennis Bast, RN, BSN  October 25, 2010 12:55 PM    Prescriptions: FUROSEMIDE 40 MG TABS (FUROSEMIDE) 1 once daily  #90 x 3   Entered by:   Dennis Bast, RN, BSN   Authorized by:   Gaylord Shih, MD, [facility]   Signed by:   Dennis Bast, RN, BSN on 10/25/2010   Method used:   Faxed to ...       [facility] (mail-order)             , [place]         Ph: [phone]       Fax: [phone]   RxID:   [id number]

## 2010-12-15 NOTE — Letter (Signed)
Summary: Clearance Letter  [facility], Main Office  [address]   [place], [place] [address]   Phone: [phone]  Fax: [phone]    November 24, 2010  Re:     Clayton Gordon Address:   [address]     [place], [place]  [address] DOB:     [birth year] MRN:     [id number]   Dear Dr. Ewing Schlein   Dr. Daleen Squibb has reviewed Clayton Gordon records and agrees with holding the Coumadin 5 days prior to the flexible sigmoidoscopy procedure. If you have any questions please do not hesitate to call our office at [phone]      Sincerely,   Dr. Valera Castle Lisabeth Devoid RN

## 2010-12-15 NOTE — Assessment & Plan Note (Signed)
Summary: f24m/dfg   Visit Type:  6 mo f/u Referring Provider:  Dr Sunnie Nielsen - ER Primary Provider:  Merlene Laughter, PMD, Dr Juanito Doom - Cards, Dr. Ewing Schlein - GI  CC:  pt states he fell this past Sunday and hit his head....denies any other complaints today.  History of Present Illness: Clayton Gordon returns today for followup and management of his outlined problems.  He bent over to pick up the newspaper this past week and fell, striking his head on the left side. He did not get this checked out despite being on Coumadin. He denies any headache, visual changes, or difficulty with being drowsy. He denies any chest pain, palpitations or syncope. The edema has been stable.  Current Medications (verified): 1)  Pravastatin Sodium 20 Mg Tabs (Pravastatin Sodium) .... 1 Once Daily 2)  Coumadin 5 Mg  Tabs (Warfarin Sodium) .... Take As Directed Per Coumadin Clinic 3)  Imdur 60 Mg  Xr24h-Tab (Isosorbide Mononitrate) .... Take 1 Tablet By Mouth Once A Day 4)  Coreg 25 Mg  Tabs (Carvedilol) .... Take 1 Tablet By Mouth Two Times A Day 5)  Nitroglycerin 0.4 Mg  Subl (Nitroglycerin) .... 1 Under Tongue Ever 5 Min X3 As Needed For Chest Pain 6)  Mucinex Dm 30-600 Mg  Xr12h-Tab (Dextromethorphan-Guaifenesin) .... 1 Tab Every 12 Hours As Needed 7)  Furosemide 40 Mg Tabs (Furosemide) .... 1 Once Daily 8)  Aciphex 20 Mg Tbec (Rabeprazole Sodium) .... 1 Tab Once Daily 9)  Lomotil 2.5-0.025 Mg Tabs (Diphenoxylate-Atropine) .... 1 Tab Once Daily As Needed 10)  Finasteride 5 Mg Tabs (Finasteride) .... 1 Tab Once Daily 11)  Zantac 150 Mg Tabs (Ranitidine Hcl) .... 1 Tab Two Times A Day 12)  Advair Diskus 250-50 Mcg/dose Aepb (Fluticasone-Salmeterol) .... 1 Puff Two Times A Day  Allergies (verified): No Known Drug Allergies  Past History:  Past Medical History: Last updated: 09/19/2010 BRONCHITIS (ICD-490) ASTHMA (ICD-493.90)  Coronary disease Atrial fibrillation paroxysmal  - s/p  pacer Bradycardia orthostatic hypotension Dysphagia NOS  - hx sounds like achalasia  - recollects woirkup with Dr. Magod  - chropnic. Liquid dysphagia worse than solids  Past Surgical History: Last updated: 01/22/2009 CABG 1996 PPM 2/02 Medtronic Sigma Right inguinal hernia repair 7.10.07  Family History: Last updated: 11/26/2008 non-contributory   Social History: Last updated: 01/22/2009 No tobacco in 20 years Married  Tobacco Use - No.  Alcohol Use - yes social drinker Drug Use - no  Risk Factors: Smoking Status: quit > 6 months (09/19/2010)  Review of Systems       negative other than history of present illness  Vital Signs:  Patient profile:   77 year old male Height:      69 inches Weight:      141.50 pounds BMI:     20 .97 Pulse rate:   66 / minute Pulse rhythm:   irregular BP sitting:   94 / 58  (left arm) Cuff size:   large  Vitals Entered By: Danielle Rankin, CMA (October 28, 2010 10:10 AM)  Physical Exam  General:  no acute distress, alert and oriented x3 Head:  normocephalic and atraumatic Eyes:  PERRLA/EOM intact; conjunctiva and lids normal. Neck:  Neck supple, no JVD. No masses, thyromegaly or abnormal cervical nodes. Lungs:  Clear bilaterally to auscultation and percussion. Heart:  PMI nondisplaced, regular rate and rhythm, variable S1-S2, no carotid bruits Msk:  decreased ROM.   Pulses:  pulses normal in all 4 extremities Extremities:  trace  left pedal edema and 1+ right pedal edema.   Neurologic:  Alert and oriented x 3. Skin:  Intact without lesions or rashes. Psych:  Normal affect.   PPM Specifications Following MD:  Lewayne Bunting, MD     PPM Vendor:  [employer]     PPM Model Number:  [id number]     PPM Serial Number:  [id number] PPM DOI:  01/09/2001     PPM Implanting MD:  Lewayne Bunting, MD  Lead 1    Location: RA     DOI: 01/09/2001     Model #: [id number]     Serial #: [id number]     Status: active Lead 2    Location: RV     DOI: 01/09/2001      Model #: 8469     Serial #: [id number]     Status: active  Magnet Response Rate:  BOL 85 ERI 65  Indications:  AFIB   PPM Follow Up Pacer Dependent:  No      Episodes Coumadin:  Yes  Parameters Mode:  DDIR     Lower Rate Limit:  60     Upper Rate Limit:  120 Paced AV Delay:  180     Impression & Recommendations:  Problem # 1:  PACEMAKER, PERMANENT (ICD-V45.01) Assessment Unchanged  Problem # 2:  EDEMA (ICD-782.3) Assessment: Improved  Problem # 3:  ATRIAL FIBRILLATION (ICD-427.31) Assessment: Unchanged  His updated medication list for this problem includes:    Coumadin 5 Mg Tabs (Warfarin sodium) .Marland Kitchen... Take as directed per coumadin clinic    Coreg 25 Mg Tabs (Carvedilol) .Marland Kitchen... Take 1 tablet by mouth two times a day  Orders: EKG w/ Interpretation (93000)  Problem # 4:  COUMADIN THERAPY (ICD-V58.61) Assessment: Unchanged patient advised if he falls again and hit his head to get it checked out that day. Verbalize understanding  Problem # 5:  CORONARY ARTERY DISEASE (ICD-414.00) Assessment: Unchanged  His updated medication list for this problem includes:    Coumadin 5 Mg Tabs (Warfarin sodium) .Marland Kitchen... Take as directed per coumadin clinic    Imdur 60 Mg Xr24h-tab (Isosorbide mononitrate) .Marland Kitchen... Take 1 tablet by mouth once a day    Coreg 25 Mg Tabs (Carvedilol) .Marland Kitchen... Take 1 tablet by mouth two times a day    Nitroglycerin 0.4 Mg Subl (Nitroglycerin) .Marland Kitchen... 1 under tongue ever 5 min x3 as needed for chest pain  Orders: EKG w/ Interpretation (93000)  Patient Instructions: 1)  Your physician recommends that you schedule a follow-up appointment in: 3 months with Dr. Daleen Squibb 2)  Your physician recommends that you continue on your current medications as directed. Please refer to the Current Medication list given to you today. Prescriptions: PRAVASTATIN SODIUM 20 MG TABS (PRAVASTATIN SODIUM) 1 once daily  #90 x 3   Entered by:   Danielle Rankin, CMA   Authorized by:   Gaylord Shih,  MD, [facility]   Signed by:   Danielle Rankin, CMA on 10/28/2010   Method used:   Faxed to ...       [facility] (mail-order)             , [place]         Ph: [phone]       Fax: [phone]   RxID:   [id number]

## 2010-12-15 NOTE — Medication Information (Signed)
Summary: rov/tm  Anticoagulant Therapy  Managed by: Weston Brass, PharmD Referring MD: Valera Castle MD PCP: Merlene Laughter, PMD, Dr Juanito Doom - Cards, Dr. Ewing Schlein - GI Supervising MD: Jens Som MD, Arlys John Indication 1: Atrial Fibrillation (ICD-427.31) Indication 2: Coronary Artery Disease (ICD-CAD) Lab Used: [facility] [place] Site: [address] INR POC 2.3 INR RANGE 2 - 2.5  Dietary changes: no    Health status changes: no    Bleeding/hemorrhagic complications: no    Recent/future hospitalizations: no    Any changes in medication regimen? no    Recent/future dental: no  Any missed doses?: no       Is patient compliant with meds? yes       Allergies: No Known Drug Allergies  Anticoagulation Management History:      The patient is taking warfarin and comes in today for a routine follow up visit.  Positive risk factors for bleeding include an age of 75 years or older.  The bleeding index is 'intermediate risk'.  Positive CHADS2 values include Age > 32 years old.  The start date was 11/13/1993.  Anticoagulation responsible provider: Jens Som MD, Arlys John.  INR POC: 2.3.  Cuvette Lot#: [id number].  Exp: 08/2011.    Anticoagulation Management Assessment/Plan:      The patient's current anticoagulation dose is Coumadin 5 mg  tabs: take as directed per coumadin clinic.  The target INR is 2 - 2.5.  The next INR is due 12/08/2010.  Anticoagulation instructions were given to patient.  Results were reviewed/authorized by Weston Brass, PharmD.  He was notified by Weston Brass PharmD.         Prior Anticoagulation Instructions: INR 2.2 Continue 1 pill everyday except 1/2 pill on Mondays, Wednesdays and Saturdays. Recheck in 4 weeks.   Current Anticoagulation Instructions: INR 2.3  Continue same dose of 1 tablet every day except 1/2 tablet on Monday, Wednesday and Friday.  Recheck INR in 4 weeks.

## 2010-12-15 NOTE — Cardiovascular Report (Signed)
Summary: Office Visit   Office Visit   Imported By: Roderic Ovens 10/24/2010 14:28:03  _____________________________________________________________________  External Attachment:    Type:   Image     Comment:   External Document

## 2010-12-15 NOTE — Letter (Signed)
Summary: Clayton Gordon - Office Visit  [name] - Office Visit   Imported By: Marylou Mccoy 11/30/2010 15:59:29  _____________________________________________________________________  External Attachment:    Type:   Image     Comment:   External Document

## 2010-12-19 ENCOUNTER — Telehealth: Payer: Self-pay | Admitting: Cardiology

## 2010-12-19 ENCOUNTER — Encounter: Payer: Self-pay | Admitting: Cardiology

## 2010-12-19 ENCOUNTER — Encounter (INDEPENDENT_AMBULATORY_CARE_PROVIDER_SITE_OTHER): Payer: Medicare Other | Admitting: Surgery

## 2010-12-19 DIAGNOSIS — I714 Abdominal aortic aneurysm, without rupture: Secondary | ICD-10-CM

## 2010-12-20 ENCOUNTER — Encounter (HOSPITAL_COMMUNITY)
Admission: RE | Admit: 2010-12-20 | Discharge: 2010-12-20 | Disposition: A | Discharge status: Home / Self Care | Payer: Medicare Other | Source: Ambulatory Visit

## 2010-12-20 ENCOUNTER — Other Ambulatory Visit: Payer: Self-pay | Admitting: Surgery

## 2010-12-20 ENCOUNTER — Encounter (HOSPITAL_COMMUNITY)
Admission: RE | Admit: 2010-12-20 | Discharge: 2010-12-20 | Disposition: A | Discharge status: Home / Self Care | Payer: Medicare Other | Source: Ambulatory Visit | Attending: Surgery | Admitting: Surgery

## 2010-12-20 DIAGNOSIS — Z01812 Encounter for preprocedural laboratory examination: Secondary | ICD-10-CM | POA: Insufficient documentation

## 2010-12-20 DIAGNOSIS — I714 Abdominal aortic aneurysm, without rupture, unspecified: Secondary | ICD-10-CM

## 2010-12-20 DIAGNOSIS — Z01818 Encounter for other preprocedural examination: Secondary | ICD-10-CM | POA: Insufficient documentation

## 2010-12-20 LAB — URINALYSIS, ROUTINE W REFLEX MICROSCOPIC
Bilirubin Urine: NEGATIVE
Hgb urine dipstick: NEGATIVE
Ketones, ur: NEGATIVE mg/dL
Protein, ur: NEGATIVE mg/dL
Urine Glucose, Fasting: NEGATIVE mg/dL

## 2010-12-20 LAB — BLOOD GAS, ARTERIAL
Bicarbonate: 26.1 mEq/L — ABNORMAL HIGH (ref 20.0–24.0)
FIO2: 0.21 %
O2 Saturation: 96.6 %
Patient temperature: 98.6

## 2010-12-20 LAB — ABO/RH: ABO/RH(D): A POS

## 2010-12-20 LAB — COMPREHENSIVE METABOLIC PANEL
ALT: 14 U/L (ref 0–53)
AST: 23 U/L (ref 0–37)
Calcium: 9.6 mg/dL (ref 8.4–10.5)
Creatinine, Ser: 1.17 mg/dL (ref 0.4–1.5)
GFR calc Af Amer: >60 mL/min (ref >60)
Sodium: 140 mEq/L (ref 135–145)
Total Protein: 6.7 g/dL (ref 6.0–8.3)

## 2010-12-20 LAB — TYPE AND SCREEN
ABO/RH(D): A POS
Antibody Screen: NEGATIVE

## 2010-12-20 LAB — CBC
HCT: 36.7 % — ABNORMAL LOW (ref 39.0–52.0)
Hemoglobin: 12 g/dL — ABNORMAL LOW (ref 13.0–17.0)
MCH: 31.8 pg (ref 26.0–34.0)
MCHC: 32.7 g/dL (ref 30.0–36.0)
MCV: 97.3 fL (ref 78.0–100.0)

## 2010-12-20 LAB — SURGICAL PCR SCREEN: Staphylococcus aureus: NEGATIVE

## 2010-12-21 ENCOUNTER — Encounter: Payer: Self-pay | Admitting: Cardiology

## 2010-12-21 NOTE — Medication Information (Signed)
Summary: rov/ejm  Anticoagulant Therapy  Managed by: Weston Brass, PharmD Referring MD: Valera Castle MD PCP: Merlene Laughter, PMD, Dr Juanito Doom - Cards, Dr. Ewing Schlein - GI Supervising MD: Shirlee Latch MD, Freida Busman Indication 1: Atrial Fibrillation (ICD-427.31) Indication 2: Coronary Artery Disease (ICD-CAD) Lab Used: [facility] [place] Site: [address] INR POC 2.5 INR RANGE 2 - 2.5  Dietary changes: no    Health status changes: no    Bleeding/hemorrhagic complications: no    Recent/future hospitalizations: no    Any changes in medication regimen? no    Recent/future dental: no  Any missed doses?: yes     Details: Missed 3 doses last week  Is patient compliant with meds? yes      Comments: Missed 3 doses last week for a colonoscopy procedure--instead of taking a 1/2 tablet he took 1 tablet for 3 days and then went back to his regular dosage  Allergies: No Known Drug Allergies  Anticoagulation Management History:      Positive risk factors for bleeding include an age of 15 years or older.  The bleeding index is 'intermediate risk'.  Positive CHADS2 values include Age > 12 years old.  The start date was 11/13/1993.  Anticoagulation responsible provider: Shirlee Latch MD, Breona Cherubin.  INR POC: 2.5.  Exp: 08/2011.    Anticoagulation Management Assessment/Plan:      The patient's current anticoagulation dose is Coumadin 5 mg  tabs: take as directed per coumadin clinic.  The target INR is 2 - 2.5.  The next INR is due 01/12/2011.  Anticoagulation instructions were given to patient.  Results were reviewed/authorized by Weston Brass, PharmD.  He was notified by Georgina Pillion PharmD.         Prior Anticoagulation Instructions: INR 2.3  Continue same dose of 1 tablet every day except 1/2 tablet on Monday, Wednesday and Friday.  Recheck INR in 4 weeks.   Current Anticoagulation Instructions: Continue taking your coumadin as scheduled with 1 tablet (5mg ) daily except for 1/2 tablet (2.5 mg) on  Mondays, Wednesdays, and Saturdays.  INR=2.5

## 2010-12-22 ENCOUNTER — Inpatient Hospital Stay (HOSPITAL_COMMUNITY): Payer: Medicare Other

## 2010-12-22 ENCOUNTER — Inpatient Hospital Stay (HOSPITAL_COMMUNITY)
Admission: RE | Admit: 2010-12-22 | Discharge: 2010-12-23 | DRG: 238 | Disposition: A | Discharge status: Home / Self Care | Payer: Medicare Other | Source: Ambulatory Visit | Attending: Surgery | Admitting: Surgery

## 2010-12-22 DIAGNOSIS — Z01812 Encounter for preprocedural laboratory examination: Secondary | ICD-10-CM

## 2010-12-22 DIAGNOSIS — Z95810 Presence of automatic (implantable) cardiac defibrillator: Secondary | ICD-10-CM

## 2010-12-22 DIAGNOSIS — Z951 Presence of aortocoronary bypass graft: Secondary | ICD-10-CM

## 2010-12-22 DIAGNOSIS — I050 Rheumatic mitral stenosis: Secondary | ICD-10-CM | POA: Diagnosis present

## 2010-12-22 DIAGNOSIS — I714 Abdominal aortic aneurysm, without rupture, unspecified: Principal | ICD-10-CM | POA: Diagnosis present

## 2010-12-22 DIAGNOSIS — Z01818 Encounter for other preprocedural examination: Secondary | ICD-10-CM

## 2010-12-22 DIAGNOSIS — Z7901 Long term (current) use of anticoagulants: Secondary | ICD-10-CM

## 2010-12-22 DIAGNOSIS — D72829 Elevated white blood cell count, unspecified: Secondary | ICD-10-CM | POA: Diagnosis present

## 2010-12-22 DIAGNOSIS — N400 Enlarged prostate without lower urinary tract symptoms: Secondary | ICD-10-CM | POA: Diagnosis present

## 2010-12-22 DIAGNOSIS — I2510 Atherosclerotic heart disease of native coronary artery without angina pectoris: Secondary | ICD-10-CM | POA: Diagnosis present

## 2010-12-22 LAB — PROTIME-INR
INR: 1.26 (ref 0.00–1.49)
Prothrombin Time: 16 seconds — ABNORMAL HIGH (ref 11.6–15.2)
Prothrombin Time: 17.2 seconds — ABNORMAL HIGH (ref 11.6–15.2)

## 2010-12-22 LAB — CBC
MCH: 32.2 pg (ref 26.0–34.0)
Platelets: 161 10*3/uL (ref 150–400)
RBC: 3.35 MIL/uL — ABNORMAL LOW (ref 4.22–5.81)
RDW: 14.7 % (ref 11.5–15.5)
WBC: 8.7 10*3/uL (ref 4.0–10.5)

## 2010-12-22 LAB — BASIC METABOLIC PANEL
BUN: 19 mg/dL (ref 6–23)
CO2: 25 mEq/L (ref 19–32)
Calcium: 9.1 mg/dL (ref 8.4–10.5)
Creatinine, Ser: 1.08 mg/dL (ref 0.4–1.5)
GFR calc Af Amer: >60 mL/min (ref >60)

## 2010-12-22 LAB — GLUCOSE, CAPILLARY: Glucose-Capillary: 172 mg/dL — ABNORMAL HIGH (ref 70–99)

## 2010-12-23 LAB — BASIC METABOLIC PANEL
CO2: 24 mEq/L (ref 19–32)
Chloride: 107 mEq/L (ref 96–112)
GFR calc Af Amer: >60 mL/min (ref >60)
Glucose, Bld: 131 mg/dL — ABNORMAL HIGH (ref 70–99)
Potassium: 4.2 mEq/L (ref 3.5–5.1)
Sodium: 135 mEq/L (ref 135–145)

## 2010-12-23 LAB — CBC
HCT: 30.6 % — ABNORMAL LOW (ref 39.0–52.0)
Hemoglobin: 10.1 g/dL — ABNORMAL LOW (ref 13.0–17.0)
MCH: 32 pg (ref 26.0–34.0)
MCV: 96.8 fL (ref 78.0–100.0)
Platelets: 133 10*3/uL — ABNORMAL LOW (ref 150–400)
RBC: 3.16 MIL/uL — ABNORMAL LOW (ref 4.22–5.81)
WBC: 20.3 10*3/uL — ABNORMAL HIGH (ref 4.0–10.5)

## 2010-12-23 NOTE — Assessment & Plan Note (Signed)
OFFICE VISIT  Clayton Gordon, Clayton Gordon DOB:  01/14/1915                                       12/19/2010 GUYQI#:[id number]  PRIMARY CARE PHYSICIAN:  Hal T. Stoneking, M.D.  REASON FOR VISIT:  Abdominal aortic aneurysm.  HISTORY:  This is a very healthy-appearing 77 year old gentleman who is sent to me at the request of Dr. Ewing Schlein for evaluation of a large infrarenal abdominal aortic aneurysm.  The patient was being evaluated by Dr. Ewing Schlein for diarrhea.  He obtained a CT scan which shows a saccular infrarenal abdominal aortic aneurysm, with maximum diameter of 6.6.  The patient does not report any abdominal pain.  He states he has approximately 10 bowel movements per day.  The patient suffers from hypertension and hypercholesterolemia, both of which are medically managed.  He is on Coumadin for atrial fibrillation. He has an AICD in place and has undergone CABG in 1996.  The patient is very active.  He exercises approximately 3 times a week.  REVIEW OF SYSTEMS:  Negative, except as above.  PAST MEDICAL HISTORY:  Atrial fibrillation, coronary artery disease status post MI and CABG in 1996, pacemaker for bradycardia (this was [employer]), hypercholesterolemia, gastroesophageal reflux disease, BPH, degenerative joint disease, macular degeneration, congestive heart failure.  FAMILY HISTORY:  Positive for coronary artery disease in his mother, father, sister.  He has a daughter with diabetes and 2 other children who have had strokes.  SOCIAL HISTORY:  He has a history smoking, quit in the 1990s.  No alcohol.  ALLERGIES:  None.  PHYSICAL EXAMINATION:  Heart rate 71, blood pressure 133/70, O2 sats 98%, temperature is 97.4.  General:  He is well-appearing, in no distress.  HEENT:  Within normal limits.  Lungs:  Clear bilaterally. Cardiovascular:  Regular rate and rhythm.  No carotid bruits.  Palpable pedal pulses.  Abdomen:  He is mildly tender over his aneurysm  which can be palpated.  Musculoskeletal:  No major deformities.  Neuro:  No focal deficits.  Skin:  Without rash.  Extremities:  Palpable dorsalis pedis pulses bilaterally.  DIAGNOSTIC STUDIES:  The patient's CT scan shows a 6.6-cm infrarenal abdominal aneurysm.  ASSESSMENT:  Abdominal aneurysm.  PLAN:  I spent approximately 45 minutes today discussing options with the patient.  We discussed continued medical management versus open repair versus endovascular repair.  Based on the fact of the size of the patient's aneurysm as well as the fact that he is somewhat tender with palpation, I recommended repair.  The patient and his wife were in full agreement with this.  I think this can be done percutaneously; however, he may require groin incisions bilaterally due to the calcification within his femoral arteries.  We discussed all the risks and benefits. We discussed the possibility of death and embolic complications to the intestines and lower extremities, the risks of general anesthesia.  The patient understands these and wants to have this done.  Because he is questionably symptomatic, I would like to get this done as soon as possible.  He is on Coumadin, so I am stopping his Coumadin today.  I will discuss this with Dr. Juanito Doom, his cardiologist, to make sure he is in agreement, and we will schedule his operation for this Thursday.    [name], MD Electronically Signed  VWB/MEDQ  D:  12/19/2010  T:  12/20/2010  Job:  [id number]  cc:   Petra Kuba, M.D. Hal T. Stoneking, M.D. Thomas C. Wall, MD, [facility]

## 2010-12-25 NOTE — Op Note (Addendum)
NAMEVIDIT, BOISSONNEAULT                ACCOUNT NO.:  [id number]  MEDICAL RECORD NO.:  [id number]           PATIENT TYPE:  I  LOCATION:  3308                         FACILITY:  [facility]  PHYSICIAN:  Juleen China IV, MDDATE OF BIRTH:  Oct 19, 1915  DATE OF PROCEDURE:  12/22/2010 DATE OF DISCHARGE:                              OPERATIVE REPORT   PREOPERATIVE DIAGNOSIS:  Symptomatic abdominal aortic aneurysm.  POSTOPERATIVE DIAGNOSIS:  Symptomatic abdominal aortic aneurysm.  PROCEDURES PERFORMED: 1. Percutaneous endovascular aneurysm repair. 2. Ultrasound-guided percutaneous bilateral common femoral artery     access. 3. Catheter in aorta x2 4. Abdominal angiogram. 5. Distal extension.  SURGEON: 1. Charlena Cross, MD  ASSISTANT:  Pecola Leisure and Gretta Began.  ANESTHESIA:  Conscious sedation with local.  BLOOD LOSS:  Minimal.  FINDINGS:  Type 2 endoleak.  DEVICES USED:  Main body was a Biomedical scientist, primary left, 28 x 14 x 12 ipsilateral extension is a Biomedical scientist 18 x 9.5 contralateral leg, right, 20 x 11.5  INDICATIONS FOR SURGERY:  Mr. Rutledge is a very healthy-appearing 77 year old gentleman, who was referred to my office by Dr. Ewing Schlein, who had obtained an abdominal CT scan for diarrhea and abdominal pain.  The CT scan showed a large 6.5-cm saccular infrarenal abdominal aortic aneurysm.  On physical examination, the patient was tender to palpation directly over his aneurysm, but nowhere else in his abdomen.  Based on his physical exam as well as the appearance of his large aneurysm on CT scan, I felt we should proceed with getting this repaired.  I felt that this was an urgent matter and would need to be done as soon as we could get him off his Coumadin.  I spoke with Dr. Valera Castle, who is aware of the situation.  PROCEDURE:  The patient was identified in the holding area and taken to room #9, placed supine on the table.  Conscious sedation was administered.   The patient was prepped and draped in the usual fashion. A time-out was called.  Antibiotics were given, 1% lidocaine was used for local anesthesia.  Ultrasound was used to evaluate the right common femoral artery.  It was calcified, mainly posteriorly and patent.  A #11 blade was used to make a skin nick.  A digital image was acquired with ultrasound.  Under ultrasound guidance, the right common femoral artery was accessed with an #18-gauge needle.  An 0.35 wire was then advanced into the aorta under fluoroscopic visualization.  The dilator from the 8- French sheath was used to dilate the subcutaneous tract.  I placed 2 Pro- Glides for preclosure in the 1 o'clock and 11 o'clock position, and 8- [place] sheath was in place.  Next, ultrasound was used to evaluate the left common femoral artery.  It was also calcified mainly posterior, but patent.  A digital ultrasound image was acquired.  A #11 blade was used to make a skin nick.  Under ultrasound guidance, the left common femoral artery was accessed with an 18-gauge needle and a 0.35 wire was then advanced into the aorta.  The subcutaneous tract was dilated with  the dilator from an 8-French sheath.  I then placed 2 ProGlide devices for preclosure in the 1 o'clock and 11 o'clock position and 8-French sheath was placed.  At this point, the patient was systemically heparinized. Over the Bentson wire on the right, the Omni Flush catheter was placed to the level of L2 and then hooked up to the power injector.  On the left side, a wire exchange was performed through a Kumpe catheter and 1.8 Amplatz super stiff wire was placed.  This was followed by an 48- [place] sheath.  The main body was prepared on the back table.  This was a Biomedical scientist 28 x 14 x 12, it was advanced over the wire through the sheath and positioned at the top of the L2 vertebral body.  It was oriented such that the limbs were crossed.  A contrast injection was then performed  with 5 degrees of left anterior oblique and 15 degrees of cranial orientation from the image intensifier.  This identified the location of the left renal artery which was a lowest left renal artery. The device was then deployed to the contralateral gate.  Next, the contralateral gate was cannulated using the Omni Flush catheter and Bentson wire, the catheter was able to be freely rotated within the main body portion of the graft confirming successful cannulation.  Amplatz super stiff wire was placed, the sheath was pulled down into the pelvis. The image intensifier was rotated to a left anterior oblique position and a retrograde sheath injection was performed delineating the location of the right hypogastric artery.  The 8-French sheath was removed and a 16-French sheath was advanced into the contralateral gate.  The contralateral limb was then prepared on the back table.  This was a Biomedical scientist 18 x 9.5 and was advanced through the sheath and then the sheath was withdrawn.  The distal end of the limb was successfully deployed landing at the level of the right hypogastric artery.  Next, the remaining portion of the ipsilateral device was deployed.  The image intensifier was then rotated to a right anterior oblique position.  A retrograde sheath injection was performed with the image intensifier in the right anterior oblique position delineating the location of the left hypogastric artery.  The ipsilateral extension was then prepared on the back table.  This was a Biomedical scientist 20 x 11.5, it was advanced through the sheath and then successfully deployed landing at the level of the left hypogastric artery.  Next, a Q50 balloon was used to mold the top portion of the graft as well as the limb overlaps.  Omni Flush catheter was then reinserted and a completion angiogram was performed.  This revealed continued patency of bilateral renal arteries as well as bilateral hypogastric arteries.  The  device was in good position.  There were no type 3 or type 1 endoleaks.  A type 2 endoleak was visualized from 2 lumbar arteries.  At this point, I was very satisfied with the repair.  I set up for closure.  The left groin was done first.  The sheath was removed and the Pro-Glides were cinched down with the knot pusher.  The wire was removed.  The left groin was hemostatic. Similarly, on the right side, the sheath was removed and a 2 Pro-Glides were cinched down for closure of the right groin.  The right groin was hemostatic and the wires were removed.  The knots were then pushed down a second time and the sutures were  trimmed.  The skin nicks were closed with 4-0 Vicryl and Dermabond was placed on the wound.  The patient's feet were very warm at the end of the case.  At this point, the patient was taken to recovery room in stable condition.     [name], MD     VWB/MEDQ  D:  12/22/2010  T:  12/23/2010  Job:  [id number]  cc:   Thomas C. Daleen Squibb, MD, [facility] Courtney Paris, M.D.  Electronically Signed by Arelia Longest IV MD on 12/25/2010 10:00:44 PM

## 2010-12-25 NOTE — Discharge Summary (Addendum)
  Clayton Gordon, Clayton Gordon                ACCOUNT NO.:  [id number]  MEDICAL RECORD NO.:  [id number]           PATIENT TYPE:  I  LOCATION:  3308                         FACILITY:  [facility]  PHYSICIAN:  Juleen China IV, MDDATE OF BIRTH:  04-12-1915  DATE OF ADMISSION:  12/22/2010 DATE OF DISCHARGE:  12/23/2010                              DISCHARGE SUMMARY   CHIEF COMPLAINT:  Abdominal aortic aneurysm.  HISTORY OF PRESENT ILLNESS:  This is a very healthy-appearing 77 year old gentleman who was sent to Dr. Myra Gianotti from Dr. Ewing Schlein for evaluation of large infrarenal abdominal aortic aneurysm.  This was revealed on CT scan with a maximum diameter of 6.6 cm.  It was infrarenal and he had sufficient neck for an endovascular repair and stent graft.  The patient did not report any abdominal pain.  He has approximately 10 bowel movements per day.  PAST MEDICAL HISTORY: 1. Diarrhea. 2. Hypertension. 3. Hypercholesterolemia. 4. Atrial fib. 5. AICD 6. Coronary artery disease, status post CABG in 1996. 7. Gastroesophageal reflux disease. 8. BPH. 9. Degenerative disk disease. 10.Congestive heart failure. 11.Macular degeneration.  HOSPITAL COURSE:  The patient was taken to the operating room on December 22, 2010, for a percutaneous endovascular abdominal aortic aneurysm repair, the main body with Gore Excluder, primary left 28 x 14 x 12.  He had flat extension with an 18 x 9.5, contralateral leg on the right with 20 x 11.5.  Postoperatively, the patient did well.  His groin wounds were healing well without hematoma.  He was voiding, eating, and ambulating without difficulty.  His vital signs remained stable.  He was afebrile.  He did have white count of 20,000 postoperatively, this was expected secondary to the stent graft and he was discharged to home on December 23, 2010.  FINAL DIAGNOSIS:  Asymptomatic large infrarenal abdominal aortic aneurysm, status post endovascular repair of  abdominal aortic aneurysm with a Gore stent graft as above.  All of his other medical issues were stable and controlled with his home medications while in-house.  DISPOSITION:  The patient was discharged to home.  He will follow up with Dr. Myra Gianotti in 4 weeks with a CT of the abdomen and pelvis to check the stent graft.  DISCHARGE MEDICATIONS: 1. Coumadin 5 mg daily. 2. Lasix 40 mg daily. 3. Macular health vitamins daily. 4. Aciphex 20 mg daily. 5. Pravastatin 20 mg daily. 6. Isosorbide mononitrate XR 60 mg daily. 7. Vitamin C daily. 8. Vitamin D daily. 9. Calcium daily. 10.Zantac 150 mg b.i.d. 11.Finasteride 5 mg daily. 12.Multivitamins daily. 13.Restore vitamins daily. 14.Oxycodone 5 mg 1 tablet every 4 hours as needed for pain.     Della Goo, PA-C   ______________________________ Seth Bake. Charlena Cross, MD    RR/MEDQ  D:  12/23/2010  T:  12/24/2010  Job:  [id number]  Electronically Signed by Arelia Longest IV MD on 12/25/2010 10:00:47 PM

## 2010-12-26 LAB — GLUCOSE, CAPILLARY: Glucose-Capillary: 116 mg/dL — ABNORMAL HIGH (ref 70–99)

## 2010-12-28 ENCOUNTER — Other Ambulatory Visit: Payer: Self-pay | Admitting: Surgery

## 2010-12-28 DIAGNOSIS — I714 Abdominal aortic aneurysm, without rupture: Secondary | ICD-10-CM

## 2010-12-28 DIAGNOSIS — Z8679 Personal history of other diseases of the circulatory system: Secondary | ICD-10-CM

## 2010-12-29 NOTE — Letter (Signed)
Summary: Clearance Letter  [employer], Main Office  [address]   [place], [place] [address]   Phone: [phone]  Fax: [phone]    December 21, 2010  Re:     Clayton Gordon Address:   [address]     [place], [place]  [address] DOB:     [birth year] MRN:     [id number]   Dear Dr. Myra Gianotti    Clayton Gordon is cleared from a cardiac standpoint for surgery as from our prior telephone conversation on 12/20/10.  If you have any questions please call our office at [phone].        Sincerely, Dr. Valera Castle Lisabeth Devoid RN

## 2010-12-29 NOTE — Progress Notes (Signed)
  Phone Note From Other Clinic   Caller: Nurse, Okey Regal from Dr. Foye Clock office Details for Reason: Surgical Clearance Summary of Call: Pt. is scheduled for Abdominal aneurism repair on 12/22/10  Initial call taken by: Ollen Gross, RN, BSN,  December 19, 2010 11:25 AM  Follow-up for Phone Call        Okey Regal states, pt. is scheduled for Abdominal aneurism repair on 12/22/10. Pt. needs surgical clearance. Caroll is aware that I will send this message to Dr. Edilberto Roosevelt and his nurse's desktop. Okey Regal would like to be call back  with any information at [phone]. Follow-up by: Ollen Gross, RN, BSN,  December 19, 2010 11:30 AM  Additional Follow-up for Phone Call Additional follow up Details #1::        I need to see first and need office note from Dr Myra Gianotti. Additional Follow-up by: Gaylord Shih, MD, [facility],  December 20, 2010 9:04 AM     Appended Document:  Call placed to Dr. Myra Gianotti office to call Dr. Daleen Squibb prior to surgery on pt. Message given to Darel Hong at [facility]. [name] RN

## 2010-12-30 ENCOUNTER — Encounter: Payer: Self-pay | Admitting: Internal Medicine

## 2010-12-30 DIAGNOSIS — I459 Conduction disorder, unspecified: Secondary | ICD-10-CM

## 2011-01-02 ENCOUNTER — Telehealth: Payer: Self-pay | Admitting: Cardiology

## 2011-01-04 ENCOUNTER — Other Ambulatory Visit: Payer: Self-pay | Admitting: Physician Assistant

## 2011-01-04 ENCOUNTER — Other Ambulatory Visit: Payer: Self-pay | Admitting: Cardiology

## 2011-01-04 ENCOUNTER — Ambulatory Visit (INDEPENDENT_AMBULATORY_CARE_PROVIDER_SITE_OTHER)
Admission: RE | Admit: 2011-01-04 | Discharge: 2011-01-04 | Disposition: A | Discharge status: Home / Self Care | Payer: Medicare Other | Source: Ambulatory Visit | Attending: Cardiology | Admitting: Cardiology

## 2011-01-04 ENCOUNTER — Ambulatory Visit (INDEPENDENT_AMBULATORY_CARE_PROVIDER_SITE_OTHER): Payer: Medicare Other | Admitting: Physician Assistant

## 2011-01-04 ENCOUNTER — Encounter: Payer: Self-pay | Admitting: Cardiology

## 2011-01-04 ENCOUNTER — Encounter (INDEPENDENT_AMBULATORY_CARE_PROVIDER_SITE_OTHER): Payer: Medicare Other

## 2011-01-04 ENCOUNTER — Other Ambulatory Visit (INDEPENDENT_AMBULATORY_CARE_PROVIDER_SITE_OTHER): Payer: Medicare Other

## 2011-01-04 ENCOUNTER — Encounter: Payer: Self-pay | Admitting: Physician Assistant

## 2011-01-04 ENCOUNTER — Encounter (INDEPENDENT_AMBULATORY_CARE_PROVIDER_SITE_OTHER): Payer: Self-pay | Admitting: *Deleted

## 2011-01-04 DIAGNOSIS — Z7901 Long term (current) use of anticoagulants: Secondary | ICD-10-CM

## 2011-01-04 DIAGNOSIS — I4891 Unspecified atrial fibrillation: Secondary | ICD-10-CM

## 2011-01-04 DIAGNOSIS — I080 Rheumatic disorders of both mitral and aortic valves: Secondary | ICD-10-CM | POA: Insufficient documentation

## 2011-01-04 DIAGNOSIS — R079 Chest pain, unspecified: Secondary | ICD-10-CM

## 2011-01-04 DIAGNOSIS — I2699 Other pulmonary embolism without acute cor pulmonale: Secondary | ICD-10-CM

## 2011-01-04 DIAGNOSIS — I2510 Atherosclerotic heart disease of native coronary artery without angina pectoris: Secondary | ICD-10-CM

## 2011-01-04 LAB — CONVERTED CEMR LAB: POC INR: 1.6

## 2011-01-04 MED ORDER — IOHEXOL 300 MG/ML  SOLN
80.0000 mL | Freq: Once | INTRAMUSCULAR | Status: AC | PRN
  Administered 2011-01-04: 80 mL via INTRAVENOUS

## 2011-01-05 ENCOUNTER — Telehealth: Payer: Self-pay | Admitting: Physician Assistant

## 2011-01-05 LAB — BASIC METABOLIC PANEL
BUN: 24 mg/dL — ABNORMAL HIGH (ref 6–23)
CO2: 31 mEq/L (ref 19–32)
Calcium: 9.8 mg/dL (ref 8.4–10.5)
GFR: 60.51 mL/min (ref >60.00)
Glucose, Bld: 102 mg/dL — ABNORMAL HIGH (ref 70–99)
Sodium: 141 mEq/L (ref 135–145)

## 2011-01-06 ENCOUNTER — Telehealth (INDEPENDENT_AMBULATORY_CARE_PROVIDER_SITE_OTHER): Payer: Self-pay | Admitting: *Deleted

## 2011-01-06 ENCOUNTER — Other Ambulatory Visit: Payer: Self-pay | Admitting: Cardiology

## 2011-01-06 DIAGNOSIS — I340 Nonrheumatic mitral (valve) insufficiency: Secondary | ICD-10-CM

## 2011-01-07 ENCOUNTER — Inpatient Hospital Stay (HOSPITAL_COMMUNITY)
Admission: EM | Admit: 2011-01-07 | Discharge: 2011-01-10 | DRG: 287 | Disposition: A | Discharge status: Home / Self Care | Payer: Medicare Other | Attending: Cardiology | Admitting: Cardiology

## 2011-01-07 ENCOUNTER — Telehealth: Payer: Self-pay | Admitting: Nurse Practitioner

## 2011-01-07 ENCOUNTER — Emergency Department (HOSPITAL_COMMUNITY): Payer: Medicare Other

## 2011-01-07 DIAGNOSIS — I10 Essential (primary) hypertension: Secondary | ICD-10-CM | POA: Diagnosis present

## 2011-01-07 DIAGNOSIS — N400 Enlarged prostate without lower urinary tract symptoms: Secondary | ICD-10-CM | POA: Diagnosis present

## 2011-01-07 DIAGNOSIS — H3530 Unspecified macular degeneration: Secondary | ICD-10-CM | POA: Diagnosis present

## 2011-01-07 DIAGNOSIS — J45909 Unspecified asthma, uncomplicated: Secondary | ICD-10-CM | POA: Diagnosis present

## 2011-01-07 DIAGNOSIS — I2582 Chronic total occlusion of coronary artery: Secondary | ICD-10-CM | POA: Diagnosis present

## 2011-01-07 DIAGNOSIS — E785 Hyperlipidemia, unspecified: Secondary | ICD-10-CM | POA: Diagnosis present

## 2011-01-07 DIAGNOSIS — I25810 Atherosclerosis of coronary artery bypass graft(s) without angina pectoris: Principal | ICD-10-CM | POA: Diagnosis present

## 2011-01-07 DIAGNOSIS — K219 Gastro-esophageal reflux disease without esophagitis: Secondary | ICD-10-CM | POA: Diagnosis present

## 2011-01-07 LAB — BASIC METABOLIC PANEL
BUN: 27 mg/dL — ABNORMAL HIGH (ref 6–23)
CO2: 28 mEq/L (ref 19–32)
Glucose, Bld: 106 mg/dL — ABNORMAL HIGH (ref 70–99)
Potassium: 4 mEq/L (ref 3.5–5.1)
Sodium: 146 mEq/L — ABNORMAL HIGH (ref 135–145)

## 2011-01-07 LAB — POCT CARDIAC MARKERS
CKMB, poc: 1.7 ng/mL (ref 1.0–8.0)
Troponin i, poc: <0.05 ng/mL (ref 0.00–0.09)

## 2011-01-07 LAB — DIFFERENTIAL
Basophils Absolute: 0 10*3/uL (ref 0.0–0.1)
Eosinophils Absolute: 0.1 10*3/uL (ref 0.0–0.7)
Eosinophils Relative: 2 % (ref 0–5)
Lymphocytes Relative: 15 % (ref 12–46)
Lymphs Abs: 1.4 10*3/uL (ref 0.7–4.0)
Monocytes Absolute: 1.1 10*3/uL — ABNORMAL HIGH (ref 0.1–1.0)

## 2011-01-07 LAB — CBC
HCT: 31.9 % — ABNORMAL LOW (ref 39.0–52.0)
MCHC: 32.6 g/dL (ref 30.0–36.0)
MCV: 96.1 fL (ref 78.0–100.0)
Platelets: 216 10*3/uL (ref 150–400)
RDW: 14.5 % (ref 11.5–15.5)
WBC: 9.4 10*3/uL (ref 4.0–10.5)

## 2011-01-07 LAB — CK TOTAL AND CKMB (NOT AT ARMC)
Relative Index: INVALID (ref 0.0–2.5)
Total CK: 37 U/L (ref 7–232)

## 2011-01-07 LAB — CARDIAC PANEL(CRET KIN+CKTOT+MB+TROPI)
CK, MB: 1.7 ng/mL (ref 0.3–4.0)
Troponin I: 0.02 ng/mL (ref 0.00–0.06)

## 2011-01-07 LAB — PROTIME-INR
INR: 1.83 — ABNORMAL HIGH (ref 0.00–1.49)
Prothrombin Time: 21.3 seconds — ABNORMAL HIGH (ref 11.6–15.2)

## 2011-01-08 DIAGNOSIS — I200 Unstable angina: Secondary | ICD-10-CM

## 2011-01-08 LAB — MRSA PCR SCREENING: MRSA by PCR: NEGATIVE

## 2011-01-08 LAB — PROTIME-INR
INR: 1.97 — ABNORMAL HIGH (ref 0.00–1.49)
Prothrombin Time: 22.6 seconds — ABNORMAL HIGH (ref 11.6–15.2)

## 2011-01-08 LAB — COMPREHENSIVE METABOLIC PANEL
AST: 18 U/L (ref 0–37)
CO2: 25 mEq/L (ref 19–32)
Calcium: 8.9 mg/dL (ref 8.4–10.5)
Creatinine, Ser: 1.02 mg/dL (ref 0.4–1.5)
GFR calc Af Amer: >60 mL/min (ref >60)
GFR calc non Af Amer: >60 mL/min (ref >60)
Total Protein: 5.8 g/dL — ABNORMAL LOW (ref 6.0–8.3)

## 2011-01-08 LAB — HEPARIN LEVEL (UNFRACTIONATED)
Heparin Unfractionated: 0.2 IU/mL — ABNORMAL LOW (ref 0.30–0.70)
Heparin Unfractionated: 0.62 IU/mL (ref 0.30–0.70)

## 2011-01-08 LAB — CBC
MCH: 31.1 pg (ref 26.0–34.0)
MCHC: 32.4 g/dL (ref 30.0–36.0)
Platelets: 185 10*3/uL (ref 150–400)
RBC: 3.41 MIL/uL — ABNORMAL LOW (ref 4.22–5.81)

## 2011-01-08 LAB — CARDIAC PANEL(CRET KIN+CKTOT+MB+TROPI)
CK, MB: 1.6 ng/mL (ref 0.3–4.0)
Total CK: 28 U/L (ref 7–232)

## 2011-01-09 ENCOUNTER — Encounter (HOSPITAL_COMMUNITY): Payer: Medicare Other

## 2011-01-09 ENCOUNTER — Other Ambulatory Visit (HOSPITAL_COMMUNITY): Payer: Medicare Other

## 2011-01-09 ENCOUNTER — Encounter: Payer: Self-pay | Admitting: Cardiology

## 2011-01-09 DIAGNOSIS — I2510 Atherosclerotic heart disease of native coronary artery without angina pectoris: Secondary | ICD-10-CM

## 2011-01-09 DIAGNOSIS — I4891 Unspecified atrial fibrillation: Secondary | ICD-10-CM

## 2011-01-09 LAB — CBC
MCH: 30.9 pg (ref 26.0–34.0)
MCHC: 32.6 g/dL (ref 30.0–36.0)
Platelets: 189 10*3/uL (ref 150–400)
RBC: 3.27 MIL/uL — ABNORMAL LOW (ref 4.22–5.81)

## 2011-01-09 LAB — HEPARIN LEVEL (UNFRACTIONATED): Heparin Unfractionated: 0.46 IU/mL (ref 0.30–0.70)

## 2011-01-09 LAB — PROTIME-INR
INR: 1.67 — ABNORMAL HIGH (ref 0.00–1.49)
Prothrombin Time: 19.9 seconds — ABNORMAL HIGH (ref 11.6–15.2)

## 2011-01-09 LAB — POCT ACTIVATED CLOTTING TIME: Activated Clotting Time: 152 seconds

## 2011-01-10 LAB — CBC
MCHC: 32.5 g/dL (ref 30.0–36.0)
Platelets: 181 10*3/uL (ref 150–400)
RDW: 14.6 % (ref 11.5–15.5)

## 2011-01-10 LAB — BASIC METABOLIC PANEL
BUN: 15 mg/dL (ref 6–23)
Chloride: 111 mEq/L (ref 96–112)
Creatinine, Ser: 1.04 mg/dL (ref 0.4–1.5)
GFR calc non Af Amer: >60 mL/min (ref >60)

## 2011-01-10 LAB — HEPARIN LEVEL (UNFRACTIONATED)
Heparin Unfractionated: 0.19 IU/mL — ABNORMAL LOW (ref 0.30–0.70)
Heparin Unfractionated: 0.41 IU/mL (ref 0.30–0.70)

## 2011-01-10 NOTE — Assessment & Plan Note (Addendum)
Summary: chest pain   Visit Type:  Follow-up Primary Provider:  Merlene Laughter, PMD, Dr Juanito Doom - Cards, Dr. Ewing Schlein - GI  CC:  chest pain.  History of Present Illness: Primary Cardiologist:  Dr. Valera Castle  Clayton Gordon is a 77 yo male with a h/o CAD, status post CABG in 1996, paroxysmal atrial fibrillation, status post pacemaker implantation secondary to significant bradycardia, Coumadin therapy, hypertension, hyperlipidemia, GERD.  He recently underwent endovascular repair of abdominal aortic aneurysm 12/23/2010.  His last Myoview study was in 2007 and demonstrated minimal peri-infarct ischemia with an EF of 60%.  His last echocardiogram in October 2010  demonstrated an EF of 50-55%, mild LVH, inferior hypokinesis; mild AS, mild MR; mild BAE, PASP 40.  He presents with chest pain.  He states that he had a significant bout of angina about 4 nights ago.  He states that this was the worst angina he's had since his bypass surgery.  He took 3 nitroglycerin.  The pain subsided after 15 minutes.  He has not had a recurrence.  He denies any associated shortness of breath, jaw pain or syncope.  He did have some left shoulder discomfort.  His activity has decreased somewhat since he had his surgical procedure 2 weeks ago.  However, he denies any chest discomfort with exertion since his angina occurred.  Of note, his Coumadin was held for his procedure.  He started back on it about 5 days ago.  We checked his INR in the office today and was 1.6.  Current Medications (verified): 1)  Pravastatin Sodium 20 Mg Tabs (Pravastatin Sodium) .Marland Kitchen.. 1 Once Daily 2)  Coumadin 5 Mg  Tabs (Warfarin Sodium) .... Take As Directed Per Coumadin Clinic 3)  Imdur 60 Mg  Xr24h-Tab (Isosorbide Mononitrate) .... Take 1 Tablet By Mouth Once A Day 4)  Coreg 25 Mg  Tabs (Carvedilol) .... Take 1 Tablet By Mouth Two Times A Day 5)  Nitroglycerin 0.4 Mg  Subl (Nitroglycerin) .Marland Kitchen.. 1 Under Tongue Ever 5 Min X3 As Needed For Chest Pain 6)   Mucinex Dm 30-600 Mg  Xr12h-Tab (Dextromethorphan-Guaifenesin) .Marland Kitchen.. 1 Tab Every 12 Hours As Needed 7)  Furosemide 40 Mg Tabs (Furosemide) .Marland Kitchen.. 1 Once Daily 8)  Aciphex 20 Mg Tbec (Rabeprazole Sodium) .Marland Kitchen.. 1 Tab Once Daily 9)  Lomotil 2.5-0.025 Mg Tabs (Diphenoxylate-Atropine) .Marland Kitchen.. 1 Tab Once Daily As Needed 10)  Finasteride 5 Mg Tabs (Finasteride) .Marland Kitchen.. 1 Tab Once Daily 11)  Zantac 150 Mg Tabs (Ranitidine Hcl) .Marland Kitchen.. 1 Tab Two Times A Day 12)  Advair Diskus 250-50 Mcg/dose Aepb (Fluticasone-Salmeterol) .Marland Kitchen.. 1 Puff Two Times A Day  Allergies (verified): No Known Drug Allergies  Past History:  Past Medical History: ASTHMA (ICD-493.90) Coronary disease   a.  s/p CABG 1996   b.  Myoview 2007: Minimal peri-infarct ischemia, EF 60% Echo October 2010:EF 50-55%; mild LVH; inf HK; mild AS, MR; mild BAE; PASP 40 Atrial fibrillation paroxysmal  - s/p pacer Bradycardia Hypertension Hyperlipidemia AAA status post endovascular repair 12/23/10 orthostatic hypotension Dysphagia NOS  - hx sounds like achalasia  - recollects woirkup with Dr. Ewing Schlein  - chropnic. Liquid dysphagia worse than solids  Family History: Reviewed history from 11/26/2008 and no changes required. non-contributory   Social History: Reviewed history from 01/22/2009 and no changes required. No tobacco in 20 years Married  Tobacco Use - No.  Alcohol Use - yes social drinker Drug Use - no  Review of Systems       As per  the HPI.  All other systems reviewed and negative.   Vital Signs:  Patient profile:   77 year old male Height:      69 inches Weight:      144 pounds Pulse rate:   76 / minute BP sitting:   130 / 68  (right arm)  Vitals Entered By: Jacquelin Hawking, CMA (January 04, 2011 2:08 PM)  Physical Exam  General:  Well nourished, well developed, in no acute distress HEENT: normal Neck: no JVD Vasc: no carotid bruits Cardiac:  normal S1, S2; RRR; 2/6 harsh systolic murmur heard best along the left  sternal border Lungs:  clear to auscultation bilaterally, no wheezing, rhonchi or rales Abd: soft, nontender, no hepatomegaly, no bruit Ext: no  edema Endo: No thyromegaly Skin: warm and dry Neuro:  CNs 2-12 intact, no focal abnormalities noted    EKG  Procedure date:  01/04/2011  Findings:      ventricular paced with a heart rate of 67 Probable underlying atrial fibrillation  PPM Specifications Following MD:  Lewayne Bunting, MD     PPM Vendor:  [employer]     PPM Model Number:  [id number]     PPM Serial Number:  [id number] PPM DOI:  01/09/2001     PPM Implanting MD:  Lewayne Bunting, MD  Lead 1    Location: RA     DOI: 01/09/2001     Model #: 6962     Serial #: [id number]     Status: active Lead 2    Location: RV     DOI: 01/09/2001     Model #: [id number]     Serial #: [id number]     Status: active  Magnet Response Rate:  BOL 85 ERI 65  Indications:  AFIB   PPM Follow Up Pacer Dependent:  No      Episodes Coumadin:  Yes  Parameters Mode:  DDIR     Lower Rate Limit:  60     Upper Rate Limit:  120 Paced AV Delay:  180     Impression & Recommendations:  Problem # 1:  CHEST PAIN (ICD-786.50) He notes having angina about 4 days ago.  This was similar to what he had prior to his bypass.  He states it was somewhat worse.  He took nitroglycerin with prompt relief.  He denies any chest discomfort with activity since that time.  Otherwise, he has felt well.  His ECG is paced.  He does have a subtherapeutic INR and had a recent surgical procedure.  I doubt he has had a pulmonary embolism but he is certainly at risk.  We will check a basic metabolic panel today.  If his creatinine is acceptable, he will have a chest CT with contrast to rule out pulmonary embolism.  I discussed his case today with Dr. Shirlee Latch.  Given his multiple comorbidities and ongoing Coumadin use and no recurrent chest symptoms since 4 days ago, we decided to proceed with stress testing initially.  He will be set up for a  Lexiscan Myoview in the next couple of days.  I have adjusted his Imdur to 90 mg a day.  We will make sure his nitroglycerin prescription is up to date.  He will be brought back in close followup with Dr. Daleen Squibb next week.  He knows to go to the ED for a recurrence of his symptoms.  Orders: TLB-BMP (Basic Metabolic Panel-BMET) (80048-METABOL) Nuclear Stress Test (Nuc Stress Test) CT Scan  (CT Scan)  Problem #  2:  MITRAL REGURGITATION (ICD-396.3) His last echocardiogram in October 2010 demonstrated mild aortic stenosis and mild mitral regurgitation.  His murmur today sounds much louder than that.  He will have a followup echocardiogram.  Orders: Echocardiogram (Echo)  Problem # 3:  ATRIAL FIBRILLATION (ICD-427.31) His Coumadin dose was adjusted today by the Coumadin clinic.  They will see him back next week for followup.  Orders: EKG w/ Interpretation (93000) TLB-BMP (Basic Metabolic Panel-BMET) (80048-METABOL)  Problem # 4:  GERD (ICD-530.81) He does not believe that his symptoms the other day were related to indigestion.  Problem # 5:  CORONARY ARTERY DISEASE (ICD-414.00) He is about 16 years out since his bypass and his last Myoview study was 5 years ago.  As noted above he'll be set up for stress testing and close followup with Dr. Daleen Squibb.  Patient Instructions: 1)  Your physician recommends that you schedule a follow-up appointment in: Next week with Dr. Daleen Squibb.  2)  Your physician has requested that you have an echocardiogram.  Echocardiography is a painless test that uses sound waves to create images of your heart. It provides your doctor with information about the size and shape of your heart and how well your heart's chambers and valves are working.  This procedure takes approximately one hour. There are no restrictions for this procedure. 3)  Your physician has requested that you have an [employer]  For further information please visit [email or web address]  Please follow  instruction sheet, as given. 4)  Non-Cardiac CT scanning, (CAT scanning), of your chest  is a noninvasive, special x-ray that produces cross-sectional images of the body using x-rays and a computer. CT scans help physicians diagnose and treat medical conditions. For some CT exams, a contrast material is used to enhance visibility in the area of the body being studied. CT scans provide greater clarity and reveal more details than regular x-ray exams. 5)  Your physician recommends that you return for lab work in: Today BMET 427.31. 6)  Your physician has recommended you make the following change in your medication: Increase Isosorbide 60 mg .Marland KitchenMarland KitchenSTART TAKING 1 AND 1/2 TABLET DAILY. Prescriptions: IMDUR 60 MG  XR24H-TAB (ISOSORBIDE MONONITRATE) 1 1/2 TAB DAILY  #180 x 3   Entered by:   Danielle Rankin, CMA   Authorized by:   Gaylord Shih, MD, [facility]   Signed by:   Danielle Rankin, CMA on 01/04/2011   Method used:   Electronically to        [facility]* (mail-order)             , [place]         Ph: [phone]       Fax: [phone]   RxID:   [id number] IMDUR 60 MG  XR24H-TAB (ISOSORBIDE MONONITRATE) 1 1/2 TAB DAILY  #60 x 11   Entered by:   Danielle Rankin, CMA   Authorized by:   Tereso Newcomer PA-C   Signed by:   Danielle Rankin, CMA on 01/04/2011   Method used:   Faxed to ...       [facility] (retail)       [phone]       [place], [place]  [address]       Ph: [phone]       Fax: [phone]   RxID:   [id number]  I have personally reviewed the prescriptions today for accuracy.. . Tereso Newcomer PA-C  January 04, 2011 4:38 PM  Appended Document: [facility]     Allergies: No Known Drug Allergies   PPM Specifications Following MD:  Lewayne Bunting, MD     PPM Vendor:  [employer]     PPM Model Number:  [id number]     PPM Serial Number:  [id number] PPM DOI:  01/09/2001     PPM Implanting MD:  Lewayne Bunting, MD  Lead 1    Location: RA     DOI: 01/09/2001     Model #:  1308     Serial #: [id number]     Status: active Lead 2    Location: RV     DOI: 01/09/2001     Model #: 9528     Serial #: [id number]     Status: active  Magnet Response Rate:  BOL 85 ERI 65  Indications:  AFIB   PPM Follow Up Pacer Dependent:  No      Episodes Coumadin:  Yes  Parameters Mode:  DDIR     Lower Rate Limit:  60     Upper Rate Limit:  120 Paced AV Delay:  180     Other Orders: Cardiac CTA (Cardiac CTA)

## 2011-01-10 NOTE — Progress Notes (Signed)
Summary: Cardiology Phone Note - Chest Pain  Phone Note Call from Patient Call back at Home Phone [phone]   Caller: Patient Summary of Call: Clayton Gordon called this morning stating that upon awakening today, he noted soreness around his pacemaker site.  he denies fever or chills.  there is no skin breakdown or discharge.  he also says that he thinks he's having some angina radiating to his left shoulder blade - on and off.  he asked if he could be seen today and i advised him that he'd have to come into the ER for evaluation. he plans to come in this afternoon. Initial call taken by: Creig Hines, ANP-BC,  January 07, 2011 11:48 AM

## 2011-01-10 NOTE — Progress Notes (Signed)
Summary: 3 nitro on sat. doing fine  Phone Note Call from Patient Call back at Home Phone [phone]   Caller: Patient Reason for Call: Talk to Nurse Summary of Call: pt took nitro on sat night x 3 @ 5 min apart.  no chestpain at this time. pt wanted bc to know.  Initial call taken by: Lorne Skeens,  January 02, 2011 9:54 AM  Follow-up for Phone Call        Phone Call Completed SPOKE WITH PT PAIN FREE AT THIS TIME  AFTER THIRD NTGTAKEN ONSATURDAY PAIN SUBSIDED .PER PT HAS NOT HAD TO TAKE NITRO IN LONG TIME HAS APPT WITH DR Viren Lebeau MID MARCH . APPT MADE FOR 01/04/11 AT 2:00 PM WITH SCOTT WEAVER PAC INSTRUCTED PT IF S/S WORSEN TO CALL OFFICE.VERBALIZED UNDERSTANDING. Follow-up by: Scherrie Bateman, LPN,  January 02, 2011 11:32 AM  Additional Follow-up for Phone Call Additional follow up Details #1::        agree with plan Additional Follow-up by: Gaylord Shih, MD, [facility],  January 06, 2011 9:00 AM

## 2011-01-10 NOTE — Progress Notes (Signed)
Summary: gave results  Phone Note Call from Patient   Caller: Patient [phone] Reason for Call: Talk to Nurse Summary of Call: pt rtn call Initial call taken by: Glynda Jaeger,  January 05, 2011 1:52 PM

## 2011-01-10 NOTE — Progress Notes (Signed)
Summary: Nuclear Pre-Procedure  Phone Note Outgoing Call Call back at [facility] Phone [phone]   Call placed by: Stanton Kidney, EMT-P,  January 06, 2011 12:17 PM Call placed to: Patient Action Taken: Phone Call Completed Summary of Call: Reviewed information on Myoview Information Sheet (see scanned document for further details).  Spoke with the patient. Stanton Kidney, EMT-P  January 06, 2011 12:17 PM      Nuclear Med Background Indications for Stress Test: Evaluation for Ischemia, Graft Patency   History: Asthma, CABG, Heart Catheterization, Myocardial Perfusion Study, Pacemaker  History Comments: '96 CABG x5 11/06 MPS: EF=60%, mild peri infarct Pacemaker  Symptoms: Chest Pain, SOB    Nuclear Pre-Procedure Cardiac Risk Factors: History of Smoking, Hypertension, Lipids Height (in): 69  Nuclear Med Study Referring MD:  Valera Castle MD

## 2011-01-10 NOTE — Progress Notes (Signed)
Summary: [facility]: Office Visit  [facility]: Office Visit   Imported By: Earl Many 01/05/2011 13:52:47  _____________________________________________________________________  External Attachment:    Type:   Image     Comment:   External Document

## 2011-01-10 NOTE — Medication Information (Signed)
Summary: rov/tm  Anticoagulant Therapy  Managed by: Bethena Midget, RN, BSN Referring MD: Valera Castle MD PCP: Merlene Laughter, PMD, Dr Juanito Doom - Cards, Dr. Ewing Schlein - GI Supervising MD: Shirlee Latch MD, [place] Indication 1: Atrial Fibrillation (ICD-427.31) Indication 2: Coronary Artery Disease (ICD-CAD) Lab Used: [facility] [place] Site: [address] INR POC 1.6 INR RANGE 2 - 2.5  Dietary changes: no    Health status changes: no    Bleeding/hemorrhagic complications: no    Recent/future hospitalizations: no    Any changes in medication regimen? no    Recent/future dental: no  Any missed doses?: no       Is patient compliant with meds? yes      Comments: Pt restarted back on coumadin on Friday s/p Surgery. Seeing PA today because of angina after taking NTG.   Allergies: No Known Drug Allergies  Anticoagulation Management History:      The patient is taking warfarin and comes in today for a routine follow up visit.  Positive risk factors for bleeding include an age of 75 years or older.  The bleeding index is 'intermediate risk'.  Positive CHADS2 values include Age > 75 years old.  The start date was 11/13/1993.  Anticoagulation responsible provider: Shirlee Latch MD, [place].  INR POC: 1.6.  Cuvette Lot#: [id number].  Exp: 11/2011.    Anticoagulation Management Assessment/Plan:      The patient's current anticoagulation dose is Coumadin 5 mg  tabs: take as directed per coumadin clinic.  The target INR is 2 - 2.5.  The next INR is due 01/12/2011.  Anticoagulation instructions were given to patient.  Results were reviewed/authorized by Bethena Midget, RN, BSN.  He was notified by Bethena Midget, RN, BSN.         Prior Anticoagulation Instructions: Continue taking your coumadin as scheduled with 1 tablet (5mg ) daily except for 1/2 tablet (2.5 mg) on Mondays, Wednesdays, and Saturdays.  INR=2.5  Current Anticoagulation Instructions: INR 1.6 Today take 5mg s then resume 5mg s daily  except 2.5mg s on Mondays, Wednesdays and Saturdays. Recheck in one week.

## 2011-01-11 ENCOUNTER — Ambulatory Visit: Payer: Medicare Other | Admitting: Physician Assistant

## 2011-01-11 NOTE — H&P (Signed)
NAMEHUTSON, Clayton Gordon                ACCOUNT NO.:  [id number]  MEDICAL RECORD NO.:  [id number]           PATIENT TYPE:  I  LOCATION:  3708                         FACILITY:  [facility]  PHYSICIAN:  Madolyn Frieze. Jens Som, MD, FACCDATE OF BIRTH:  Feb 27, 1915  DATE OF ADMISSION:  01/07/2011 DATE OF DISCHARGE:                             HISTORY & PHYSICAL   The patient is a 77 year old male with past medical history of coronary artery disease status post coronary bypassing graft in 1996, atrial fibrillation, prior pacemaker placement, hypertension, hyperlipidemia, status post recent endovascular repair of abdominal aortic aneurysm who presents with chest pain.  The patient's last Myoview was performed in 2007.  The ejection fraction was 60% and there apparently was minimal peri-infarct ischemia.  His last echocardiogram in October 2010 showed an ejection fraction of 50% to 55%, mild left ventricular hypertrophy, inferior hypokinesis, mild aortic stenosis, mild mitral regurgitation, and mild biatrial enlargement.  The patient did undergo endovascular repair of an abdominal aortic aneurysm on December 23, 2010.  On December 31, 2010, the patient had a significant episode of chest pain. It was like his previous angina resolved after three nitroglycerin.  He was seen in the office by Tereso Newcomer on January 04, 2011, with this complaint.  At that time, there was concern about a possible pulmonary embolus.  A CTA was performed but I do not have those records available. A Myoview and followup echocardiogram was planned.  However, the patient developed recurrent chest pain today.  It was substernal in location with radiation to his left arm and back.  It was similar to his previous angina.  It was not pleuritic, positional, or related to food.  There was mild shortness of breath but no diaphoresis or nausea.  The pain lasted for approximately 3 hours and resolved spontaneously.  He is presently pain  free.  He also noted a tenderness and some pain with palpation over his pacemaker.  Because of the above, he presented to the emergency room for further evaluation.  PRESENT MEDICATIONS: 1. Imdur 90 mg p.o. daily. 2. Pravachol 20 mg p.o. daily. 3. Coumadin as directed. 4. Coreg 25 mg p.o. b.i.d. 5. Mucinex p.r.n. 6. Lasix 40 mg p.o. daily. 7. Aciphex 20 mg p.o. daily. 8. Lomotil as needed. 9. Finasteride 5 mg p.o. daily. 10.Zantac 150 mg p.o. b.i.d. 11.Advair Diskus 1 puff b.i.d.  He has no known drug allergies.  His past medical history is significant for hypertension and hyperlipidemia, but there is no diabetes mellitus.  He does have a history of asthma.  He has a history of coronary artery disease and is status post coronary artery bypass graft in 1996.  He also has a history of atrial fibrillation and has had a previous pacemaker placed.  He has an abdominal aortic aneurysm repaired with an endovascular stent on December 15, 2010.  He also has a history of chronic dysphagia.  SOCIAL HISTORY:  The patient does not smoke.  He rarely consumes alcohol.  His family history is positive for coronary artery disease.  REVIEW OF SYSTEMS:  He denies any headaches or fevers or chills.  There is no productive cough or hemoptysis.  There is no dysphagia, odynophagia, melena, or hematochezia.  There is no dysuria or hematuria. There is no rash or seizure activity.  There is no orthopnea, PND, or pedal edema.  The remaining systems are negative.  PHYSICAL EXAMINATION:  VITAL SIGNS:  Pulse of 64.  His blood pressure is 130/70. GENERAL:  He is well developed, well nourished, in no acute distress. His skin is warm and dry.  He does not appear to be depressed.  There is no peripheral clubbing. BACK:  Normal. HEENT:  Normal.  Normal eyelids. NECK:  Supple with normal upstroke bilaterally.  No bruits noted.  There is no jugular venous distention and I cannot appreciate  thyromegaly. CHEST:  Clear to auscultation.  Normal expansion.  Note, his pacemaker site is without evidence of erythema or hematoma.  There is minimal tenderness to palpation.  He has 2+ femoral pulses bilaterally, no bruits. CARDIOVASCULAR:  Regular rate and rhythm.  Normal S1 and S2.  There is a 3/6 systolic murmur at the left sternal border.  S2 is not diminished. ABDOMEN:  Nontender, nondistended, positive bowel sounds.  No hepatosplenomegaly.  No masses appreciated.  There is no abdominal bruit. EXTREMITIES:  No edema and I can palpate no cords.  He has 1+ dorsalis pedis pulses bilaterally. NEUROLOGIC:  Grossly intact.  LABORATORIES:  His chest x-ray shows no acute disease.  His INR is 1.83. Initial point-of-care markers are negative.  Sodium is 146 with potassium of 4.  BUN and creatinine are 27 and 1.15.  His white blood cell count is 9.4 with a hemoglobin of 10.4, hematocrit is 31.9.  The patient's platelet count is 216,000.  DIAGNOSES: 1. Chest pain - the patient's symptoms are concerning.  We will admit     and rule out myocardial infarction with serial enzymes.  The     patient will need cardiac catheterization.  I will hold his     Coumadin in anticipation of that and placed the patient on IV     heparin.  We will also add aspirin.  He will continue on his     nitrates and beta-blocker as well as his statin.  He may need a     radial approach secondary to his recent endovascular stent.  Also     of note, the risk and benefits of the procedure were discussed and     he agrees to proceed.  I will also hold his Lasix in anticipation     of cardiac catheterization.  Also of note, the patient did have a     recent CTA and we will need to obtain the final results of that.     He will continue on heparin in the meantime, although his symptoms     do not sound like a pulmonary embolus based on history. 2. Mild pacemaker tenderness.  There is no evidence of infection.  We      will have his device interrogated on Monday morning. 3. Coronary artery disease - he will be treated with aspirin, heparin,     and beta-blocker, and his statin will be continued. 4. Atrial fibrillation - we will plan to resume his Coumadin following     his procedure. 5. Hypertension. 6. Hyperlipidemia - continue statin. 7. Status post endovascular repair of abdominal aortic aneurysm.     Madolyn Frieze Jens Som, MD, [facility]     BSC/MEDQ  D:  01/07/2011  T:  01/08/2011  Job:  [id number]  Electronically Signed by Olga Millers MD [facility] on 01/11/2011 03:38:51 PM

## 2011-01-12 NOTE — Procedures (Signed)
NAMEKAHLI, FITZGERALD                ACCOUNT NO.:  [id number]  MEDICAL RECORD NO.:  [id number]           PATIENT TYPE:  I  LOCATION:  3708                         FACILITY:  [facility]  PHYSICIAN:  Vesta Mixer, M.D. DATE OF BIRTH:  1915/10/07  DATE OF PROCEDURE:  01/09/2011 DATE OF DISCHARGE:                           CARDIAC CATHETERIZATION   Clayton Gordon is a 77 year old gentleman with a history of coronary artery disease.  He is status post coronary artery bypass grafting.  He also is status post recent stent graft repair of an abdominal aortic aneurysm.  He presented to the hospital over the weekend with significant episodes of chest pain.  He is referred for heart catheterization for further evaluation.  His cardiac enzymes are negative.  PROCEDURE:  Left heart catheterization with coronary angiography.  The right femoral artery was easily cannulated using a modified Seldinger technique.  HEMODYNAMICS:  LV pressure is 166/12.  The aortic pressure is 156/69.  ANGIOGRAPHY:  Left main.  The left main is small and is very heavily calcified.  There is diffuse disease.  LAD.  The left anterior descending artery is occluded.  There is a very small septal branch, which is visible.  Left circumflex artery.  The left circumflex artery is occluded in the proximal/mid segment.  Right coronary artery.  There is an ostial 60% to 70% stenosis and then the right coronary artery is occluded at its midpoint.  There is very faint right to right and left to right collateral filling of the distal RCA.  Saphenous vein graft to the obtuse marginal artery #1 and obtuse marginal artery #2 is a very large and normal graft.  The anastomosis to the first OM and the second OM are normal.  There is brisk flow down the obtuse marginal arteries.  Saphenous vein graft to the right coronary artery.  The saphenous vein graft to the RCA is occluded proximally.  No further saphenous vein grafts were  found.  He describes having additional bypasses, but states that one of his bypass grafts failed almost immediately following surgery.  Left internal mammary artery to the diagonal artery:  The LIMA to first diagonal artery is widely patent.  The left anterior descending artery fills in a retrograde fashion and fills very well.  There are no significant stenoses in the LAD.  As noted before, it is possible that he could have an additional saphenous vein graft to the LAD, but no graft is found.  There is no evidence of competitive flow.  Left ventriculogram.  The left ventriculogram was performed in a 30-RAO position.  It reveals inferobasal and inferoapical akinesis.  Ejection fraction is 40% to 45%.  There is mild mitral regurgitation.  It should be noted that there was no significant difficulty in crossing the aortic valve despite a gradient of 10 mmHg.  COMPLICATIONS:  None.  CONCLUSIONS: 1. Severe three-vessel native coronary artery disease. 2. Patent left internal mammary artery to the diagonal/LAD system and     a patent saphenous vein graft to the obtuse marginal artery system.     The right coronary artery graft and the  native right coronary     artery occluded and the distal RCA fills via collateral vessels.     This area is seen to be infarcted.  We will continue with medical     therapy.     Vesta Mixer, M.D.     PJN/MEDQ  D:  01/09/2011  T:  01/09/2011  Job:  [id number]  cc:   Thomas C. Wall, MD, FACC V. Charlena Cross, MD  Electronically Signed by Kristeen Miss M.D. on 01/12/2011 06:16:57 PM

## 2011-01-16 ENCOUNTER — Encounter (INDEPENDENT_AMBULATORY_CARE_PROVIDER_SITE_OTHER): Payer: Medicare Other

## 2011-01-16 ENCOUNTER — Ambulatory Visit: Payer: Medicare Other

## 2011-01-16 ENCOUNTER — Ambulatory Visit (INDEPENDENT_AMBULATORY_CARE_PROVIDER_SITE_OTHER): Payer: Medicare Other | Admitting: Surgery

## 2011-01-16 ENCOUNTER — Encounter: Payer: Self-pay | Admitting: Cardiology

## 2011-01-16 DIAGNOSIS — I714 Abdominal aortic aneurysm, without rupture: Secondary | ICD-10-CM

## 2011-01-16 DIAGNOSIS — I4891 Unspecified atrial fibrillation: Secondary | ICD-10-CM

## 2011-01-16 DIAGNOSIS — Z7901 Long term (current) use of anticoagulants: Secondary | ICD-10-CM

## 2011-01-16 DIAGNOSIS — Z48812 Encounter for surgical aftercare following surgery on the circulatory system: Secondary | ICD-10-CM

## 2011-01-16 LAB — CONVERTED CEMR LAB: POC INR: 2.1

## 2011-01-17 NOTE — Assessment & Plan Note (Signed)
OFFICE VISIT  Clayton Gordon, Clayton Gordon DOB:  1914-12-18                                       01/16/2011 HYQMV#:[id number]  The patient comes back in today for followup of his aneurysm repair which was done on February 8.  This was done percutaneously.  He had a symptomatic 6.5 cm infrarenal aneurysm.  Since his discharge the patient has required hospitalization for chest pain and ultimately underwent cardiac catheterization.  His cardiac disease is being treated medically.  He comes back in today with no complaints.  On examination his groin access sites are well-healed.  His abdomen is soft.  He is mildly tender over his stent graft.  However, the pulsatility which was present before his graft went in is gone.  The patient had a type 2 endo leak at the end of his case.  This needs to be followed up with a CT scan.  He did not get his scheduled CT scan today because he was hoping that his PE protocol scan from 2 weeks ago would suffice.  However, this did not image his abdomen.  Therefore, I think we need to go ahead and proceed with a CT angiogram of his abdomen and pelvis.  He will get this done and see me back in 2-3 weeks.    [name], MD Electronically Signed  VWB/MEDQ  D:  01/16/2011  T:  01/17/2011  Job:  [id number]  cc:   Thomas C. Daleen Squibb, MD, [facility] Petra Kuba, M.D. Hal T. Stoneking, M.D.

## 2011-01-18 ENCOUNTER — Other Ambulatory Visit: Payer: Self-pay | Admitting: Surgery

## 2011-01-18 DIAGNOSIS — I714 Abdominal aortic aneurysm, without rupture: Secondary | ICD-10-CM

## 2011-01-19 ENCOUNTER — Telehealth: Payer: Self-pay | Admitting: Cardiology

## 2011-01-19 NOTE — Letter (Signed)
Summary: [facility] Office Visit  [facility] Office Visit   Imported By: Marylou Mccoy 01/09/2011 08:37:34  _____________________________________________________________________  External Attachment:    Type:   Image     Comment:   External Document

## 2011-01-24 LAB — CK TOTAL AND CKMB (NOT AT ARMC)
CK, MB: 1.8 ng/mL (ref 0.3–4.0)
Total CK: 46 U/L (ref 7–232)

## 2011-01-24 LAB — CARDIAC PANEL(CRET KIN+CKTOT+MB+TROPI)
CK, MB: 2.4 ng/mL (ref 0.3–4.0)
CK, MB: 2.5 ng/mL (ref 0.3–4.0)
CK, MB: 3 ng/mL (ref 0.3–4.0)
CK, MB: 3.4 ng/mL (ref 0.3–4.0)
Relative Index: 2 (ref 0.0–2.5)
Relative Index: 2.2 (ref 0.0–2.5)
Relative Index: 3.1 — ABNORMAL HIGH (ref 0.0–2.5)
Relative Index: INVALID (ref 0.0–2.5)
Relative Index: INVALID (ref 0.0–2.5)
Total CK: 108 U/L (ref 7–232)
Total CK: 116 U/L (ref 7–232)
Total CK: 153 U/L (ref 7–232)
Total CK: 66 U/L (ref 7–232)
Troponin I: 0.03 ng/mL (ref 0.00–0.06)
Troponin I: 0.04 ng/mL (ref 0.00–0.06)
Troponin I: 0.05 ng/mL (ref 0.00–0.06)
Troponin I: 0.05 ng/mL (ref 0.00–0.06)
Troponin I: 0.05 ng/mL (ref 0.00–0.06)

## 2011-01-24 LAB — CBC
HCT: 31.5 % — ABNORMAL LOW (ref 39.0–52.0)
HCT: 33.8 % — ABNORMAL LOW (ref 39.0–52.0)
HCT: 33.9 % — ABNORMAL LOW (ref 39.0–52.0)
Hemoglobin: 11.2 g/dL — ABNORMAL LOW (ref 13.0–17.0)
Hemoglobin: 11.4 g/dL — ABNORMAL LOW (ref 13.0–17.0)
MCH: 31.9 pg (ref 26.0–34.0)
MCH: 32.5 pg (ref 26.0–34.0)
MCH: 33.1 pg (ref 26.0–34.0)
MCHC: 33 g/dL (ref 30.0–36.0)
MCHC: 33.6 g/dL (ref 30.0–36.0)
MCHC: 33.7 g/dL (ref 30.0–36.0)
MCHC: 33.7 g/dL (ref 30.0–36.0)
MCV: 98.3 fL (ref 78.0–100.0)
MCV: 98.3 fL (ref 78.0–100.0)
MCV: 99 fL (ref 78.0–100.0)
Platelets: 108 10*3/uL — ABNORMAL LOW (ref 150–400)
Platelets: 125 10*3/uL — ABNORMAL LOW (ref 150–400)
Platelets: 141 10*3/uL — ABNORMAL LOW (ref 150–400)
Platelets: 99 10*3/uL — ABNORMAL LOW (ref 150–400)
RBC: 3.44 MIL/uL — ABNORMAL LOW (ref 4.22–5.81)
RBC: 3.45 MIL/uL — ABNORMAL LOW (ref 4.22–5.81)
RDW: 14.4 % (ref 11.5–15.5)
RDW: 14.4 % (ref 11.5–15.5)
RDW: 14.8 % (ref 11.5–15.5)
RDW: 15 % (ref 11.5–15.5)
RDW: 15 % (ref 11.5–15.5)
WBC: 13.8 10*3/uL — ABNORMAL HIGH (ref 4.0–10.5)
WBC: 17.7 10*3/uL — ABNORMAL HIGH (ref 4.0–10.5)
WBC: 18.7 10*3/uL — ABNORMAL HIGH (ref 4.0–10.5)
WBC: 9.5 10*3/uL (ref 4.0–10.5)

## 2011-01-24 LAB — BASIC METABOLIC PANEL
BUN: 19 mg/dL (ref 6–23)
BUN: 22 mg/dL (ref 6–23)
BUN: 23 mg/dL (ref 6–23)
BUN: 25 mg/dL — ABNORMAL HIGH (ref 6–23)
CO2: 23 mEq/L (ref 19–32)
CO2: 25 mEq/L (ref 19–32)
CO2: 25 mEq/L (ref 19–32)
CO2: 27 mEq/L (ref 19–32)
Calcium: 8.2 mg/dL — ABNORMAL LOW (ref 8.4–10.5)
Calcium: 8.6 mg/dL (ref 8.4–10.5)
Calcium: 8.6 mg/dL (ref 8.4–10.5)
Calcium: 8.7 mg/dL (ref 8.4–10.5)
Chloride: 102 mEq/L (ref 96–112)
Chloride: 106 mEq/L (ref 96–112)
Chloride: 112 mEq/L (ref 96–112)
Creatinine, Ser: 1.01 mg/dL (ref 0.4–1.5)
Creatinine, Ser: 1.05 mg/dL (ref 0.4–1.5)
Creatinine, Ser: 1.26 mg/dL (ref 0.4–1.5)
Creatinine, Ser: 1.3 mg/dL (ref 0.4–1.5)
GFR calc Af Amer: >60 mL/min (ref >60)
GFR calc Af Amer: >60 mL/min (ref >60)
GFR calc non Af Amer: 52 mL/min — ABNORMAL LOW (ref >60)
GFR calc non Af Amer: 60 mL/min — ABNORMAL LOW (ref >60)
GFR calc non Af Amer: >60 mL/min (ref >60)
Glucose, Bld: 106 mg/dL — ABNORMAL HIGH (ref 70–99)
Glucose, Bld: 112 mg/dL — ABNORMAL HIGH (ref 70–99)
Glucose, Bld: 129 mg/dL — ABNORMAL HIGH (ref 70–99)
Glucose, Bld: 97 mg/dL (ref 70–99)
Potassium: 3.6 mEq/L (ref 3.5–5.1)
Potassium: 4.4 mEq/L (ref 3.5–5.1)
Sodium: 140 mEq/L (ref 135–145)
Sodium: 141 mEq/L (ref 135–145)

## 2011-01-24 LAB — DIFFERENTIAL
Basophils Absolute: 0 10*3/uL (ref 0.0–0.1)
Basophils Absolute: 0 10*3/uL (ref 0.0–0.1)
Basophils Relative: 0 % (ref 0–1)
Basophils Relative: 0 % (ref 0–1)
Eosinophils Absolute: 0 10*3/uL (ref 0.0–0.7)
Eosinophils Absolute: 0.3 10*3/uL (ref 0.0–0.7)
Eosinophils Relative: 0 % (ref 0–5)
Lymphocytes Relative: 3 % — ABNORMAL LOW (ref 12–46)
Monocytes Absolute: 1.1 10*3/uL — ABNORMAL HIGH (ref 0.1–1.0)
Monocytes Relative: 13 % — ABNORMAL HIGH (ref 3–12)
Neutro Abs: 6.4 10*3/uL (ref 1.7–7.7)
Neutrophils Relative %: 73 % (ref 43–77)

## 2011-01-24 LAB — APTT: aPTT: 60 seconds — ABNORMAL HIGH (ref 24–37)

## 2011-01-24 LAB — CULTURE, BLOOD (ROUTINE X 2)
Culture  Setup Time: 201111071232
Culture: NO GROWTH

## 2011-01-24 LAB — URINALYSIS, ROUTINE W REFLEX MICROSCOPIC
Bilirubin Urine: NEGATIVE
Glucose, UA: NEGATIVE mg/dL
Hgb urine dipstick: NEGATIVE
Ketones, ur: NEGATIVE mg/dL
pH: 5.5 (ref 5.0–8.0)

## 2011-01-24 LAB — PROCALCITONIN: Procalcitonin: 0.27 ng/mL

## 2011-01-24 LAB — POCT I-STAT 3, ART BLOOD GAS (G3+)
pCO2 arterial: 41.2 mmHg (ref 35.0–45.0)
pH, Arterial: 7.391 (ref 7.350–7.450)

## 2011-01-24 LAB — MAGNESIUM
Magnesium: 2.2 mg/dL (ref 1.5–2.5)
Magnesium: 2.2 mg/dL (ref 1.5–2.5)

## 2011-01-24 LAB — URINE CULTURE
Colony Count: NO GROWTH
Culture  Setup Time: 201111080109

## 2011-01-24 LAB — POCT CARDIAC MARKERS: Troponin i, poc: <0.05 ng/mL (ref 0.00–0.09)

## 2011-01-24 LAB — LACTIC ACID, PLASMA: Lactic Acid, Venous: 1.2 mmol/L (ref 0.5–2.2)

## 2011-01-24 LAB — PHOSPHORUS
Phosphorus: 2.1 mg/dL — ABNORMAL LOW (ref 2.3–4.6)
Phosphorus: 2.6 mg/dL (ref 2.3–4.6)
Phosphorus: 2.9 mg/dL (ref 2.3–4.6)

## 2011-01-24 LAB — PROTIME-INR
INR: 2.55 — ABNORMAL HIGH (ref 0.00–1.49)
Prothrombin Time: 25 seconds — ABNORMAL HIGH (ref 11.6–15.2)

## 2011-01-24 LAB — STREP PNEUMONIAE URINARY ANTIGEN: Strep Pneumo Urinary Antigen: NEGATIVE

## 2011-01-24 LAB — CORTISOL: Cortisol, Plasma: 66 ug/dL

## 2011-01-24 LAB — MRSA PCR SCREENING: MRSA by PCR: POSITIVE — AB

## 2011-01-24 NOTE — Medication Information (Signed)
Summary: rov/tm  Anticoagulant Therapy  Managed by: Bayard Hugger, PharmD Referring MD: Valera Castle MD PCP: Merlene Laughter, PMD, Dr Juanito Doom - Cards, Dr. Ewing Schlein - GI Supervising MD: Daleen Squibb MD, Maisie Fus Indication 1: Atrial Fibrillation (ICD-427.31) Indication 2: Coronary Artery Disease (ICD-CAD) Lab Used: [facility] [place] Site: [address] INR POC 2.1 INR RANGE 2 - 2.5  Dietary changes: no    Health status changes: no    Bleeding/hemorrhagic complications: no    Recent/future hospitalizations: no    Any changes in medication regimen? no    Recent/future dental: no  Any missed doses?: no       Is patient compliant with meds? yes      Comments: recently hospitalized, back on coumadin after surgery on 2/25  Allergies: No Known Drug Allergies  Anticoagulation Management History:      Positive risk factors for bleeding include an age of 75 years or older.  The bleeding index is 'intermediate risk'.  Positive CHADS2 values include Age > 75 years old.  The start date was 11/13/1993.  Anticoagulation responsible provider: Daleen Squibb MD, Maisie Fus.  INR POC: 2.1.  Cuvette Lot#: [id number].  Exp: 11/2011.    Anticoagulation Management Assessment/Plan:      The patient's current anticoagulation dose is Coumadin 5 mg  tabs: take as directed per coumadin clinic.  The target INR is 2 - 2.5.  The next INR is due 02/16/2011.  Anticoagulation instructions were given to patient.  Results were reviewed/authorized by Bayard Hugger, PharmD.         Prior Anticoagulation Instructions: INR 1.6 Today take 5mg s then resume 5mg s daily except 2.5mg s on Mondays, Wednesdays and Saturdays. Recheck in one week.   Current Anticoagulation Instructions: INR 2.1 The patient is to continue with the same dose of coumadin.  This dosage includes:  1/2 tablet (2.5mg ) on Mon, Wed, Sat 1 tablet (5mg ) on Sun, Tue, Thu, and Fri Recheck INR on 04/05

## 2011-01-24 NOTE — Cardiovascular Report (Signed)
Summary: TTM   TTM   Imported By: Roderic Ovens 01/20/2011 14:51:34  _____________________________________________________________________  External Attachment:    Type:   Image     Comment:   External Document

## 2011-01-24 NOTE — Progress Notes (Signed)
Summary: any test prior to appt  Phone Note Call from Patient Call back at Home Phone [phone] P PH     Caller: Patient Reason for Call: Talk to Nurse Summary of Call: does pt need to have any test prior before to see tw.  Initial call taken by: Lorne Skeens,  January 19, 2011 12:49 PM  Follow-up for Phone Call        I spoke with pt and [facility].  They will be calling pt today re: appt for CT. [name] RN

## 2011-01-26 ENCOUNTER — Encounter: Payer: Self-pay | Admitting: Cardiology

## 2011-01-26 ENCOUNTER — Ambulatory Visit (INDEPENDENT_AMBULATORY_CARE_PROVIDER_SITE_OTHER): Payer: Medicare Other | Admitting: Cardiology

## 2011-01-26 DIAGNOSIS — I25810 Atherosclerosis of coronary artery bypass graft(s) without angina pectoris: Secondary | ICD-10-CM

## 2011-01-26 DIAGNOSIS — I714 Abdominal aortic aneurysm, without rupture, unspecified: Secondary | ICD-10-CM | POA: Insufficient documentation

## 2011-01-26 DIAGNOSIS — I2510 Atherosclerotic heart disease of native coronary artery without angina pectoris: Secondary | ICD-10-CM

## 2011-01-27 NOTE — Discharge Summary (Signed)
NAMEEASTYN, DATTILO                ACCOUNT NO.:  [id number]  MEDICAL RECORD NO.:  [id number]           PATIENT TYPE:  I  LOCATION:  3708                         FACILITY:  [facility]  PHYSICIAN:  Christalyn Goertz C. Carmela Piechowski, MD, FACCDATE OF BIRTH:  [birth year]  DATE OF ADMISSION:  01/07/2011 DATE OF DISCHARGE:  01/10/2011                              DISCHARGE SUMMARY   PROCEDURES: 1. Cardiac catheterization. 2. Coronary arteriogram. 3. Left internal mammary artery arteriogram. 4. Saphenous vein angiogram. 5. Left ventriculogram.  PRIMARY FINAL DISCHARGE DIAGNOSIS:  Chest pain, medical therapy for coronary artery disease recommended.  SECONDARY DIAGNOSES: 1. Hypertension. 2. Hyperlipidemia. 3. Asthma. 4. Status post aortocoronary bypass surgery in 1996. 5. Paroxysmal atrial fibrillation and flutter status post ablation. 6. History of bradycardia status post Medtronic dual-chamber pacemaker     DDIR. 7. Gastroesophageal reflux disease. 8. Benign prostatic hypertrophy. 9. Macular degeneration. 10.History of psoriasis. 11.Status post right inguinal hernia repair. 12.Remote history of inferior myocardial infarction. 13.History of iron-deficiency anemia. 14.Symptomatic abdominal aortic aneurysm, infrarenal with a 6.6 cm     diameter treated with percutaneous endovascular repair. 15.Remote history of tobacco use, quit in 1983, approximately 25 pack-     year history. 16.History of foot surgery. 17.History of pneumonia, aspiration.  TIME AT DISCHARGE:  39 minutes.  HOSPITAL COURSE:  Mr. Gentile is a 77 year old male with a history of coronary artery disease.  He was evaluated for chest pain with a CT angiogram, which showed no PE and was scheduled for a Myoview, but had recurrent chest pain and came to the hospital where he was admitted for further evaluation and treatment.  Mr. Dyas had been on Coumadin prior to admission and his INR was slightly subtherapeutic at 1.83.  The Coumadin  was held and his INR was allowed to drift down.  He was anticoagulated with heparin.  His cardiac enzymes were cycled and were negative.  TSH was within normal limits as well.  His albumin was low at 2.7 and total protein was slightly low at 5.8, but other LFTs were within normal limits.  His renal function was checked because of the recent angiogram of his chest and was stable.  He was anemic with a hemoglobin of 10.4 and hematocrit of 31.9, but this did not change significantly during his hospital stay.  He is encouraged to be on iron at discharge.  On January 09, 2011, his INR was subtherapeutic and he was taken to the cath lab.  He had severe native three-vessel disease with the LAD, circumflex, RCA; all occluded.  The SVG to OM1 and OM2 was patent.  The SVG to the RCA was occluded proximally.  The patient described having additional back passes, but no further saphenous vein grafts were found.  The LIMA to diagonal was widely patent and the LAD filled retrograde very well.  There was no critical distal disease.  His EF was 40% to 45%, ischemic cardiomyopathy with mild MR.  He had a 10-mm gradient on the aortic valve, but there was no difficulty crossing it.  The RCA was seen to be infarcted, but was filling via collateral vessels.  Medical therapy was recommended.  On January 10, 2011, Mr. Quadros was seen by Dr. Elease Hashimoto and by Dr. Daleen Squibb. He is to follow up with Dr. Daleen Squibb.  Because of his ischemic cardiomyopathy, an ACE inhibitor was considered, but Dr. Daleen Squibb did not recommend adding it at this time.  He is on a beta-blocker and this will be continued.  Chest x-ray had been checked on admission and showed no acute disease.  Mr. Dargis was considered stable for discharge, to follow up as an outpatient.  DISCHARGE INSTRUCTIONS:  His activity level is to be increased gradually.  He is encouraged to stick to a low-sodium heart-healthy diet.  He is to call our office for problems with the  cath site.  He is to get an INR next week.  He is to follow up with Dr. Daleen Squibb on January 26, 2011, at 10:30 and with Dr. Pete Glatter and Dr. Ewing Schlein as needed.  DISCHARGE MEDICATIONS: 1. Process are 5 mg at bedtime. 2. Coumadin 5 mg, take as directed. 3. Pravastatin 20 mg at bedtime. 4. Zantac 150 mg b.i.d. 5. Coreg 25 mg b.i.d. 6. Lasix 40 mg a day. 7. Multivitamins with iron daily. 8. Imdur 60 mg 1-1/2 tablets daily. 9. Aspirin 81 mg daily. 10.AcipHex 20 mg at bedtime. 11.Vitamin C, vitamin D OTC as well as Macular Health vitamins daily. 12.Calcium and vitamin D daily. 13.Advair 250/50 b.i.d. as at home.     Theodore Demark, PA-C   ______________________________ Jesse Sans Daleen Squibb, MD, [facility]    RB/MEDQ  D:  01/10/2011  T:  01/10/2011  Job:  [id number]  cc:   Petra Kuba, M.D. Hal T. Stoneking, M.D.  Electronically Signed by Theodore Demark PA-C on 01/16/2011 06:58:40 AM Electronically Signed by Valera Castle MD [employer] on 01/27/2011 09:17:16 AM

## 2011-01-31 NOTE — Assessment & Plan Note (Signed)
Summary: EPH/dfg/agh   Visit Type:  Follow-up Referring Provider:  Dr Sunnie Nielsen - ER Primary Provider:  Merlene Laughter, PMD, Dr Juanito Doom - Cards, Dr. Ewing Schlein - GI  CC:  headaches.  History of Present Illness: Clayton Gordon returns for followup and management of his CAD. He recently has had increased angina and underwent cardiac cath. Please see note.....medical therapy. Since increasing his meds he has felt better with no recurrent angina.  Current Medications (verified): 1)  Pravastatin Sodium 20 Mg Tabs (Pravastatin Sodium) .Marland Kitchen.. 1 Once Daily 2)  Coumadin 5 Mg  Tabs (Warfarin Sodium) .... Take As Directed Per Coumadin Clinic 3)  Imdur 60 Mg  Xr24h-Tab (Isosorbide Mononitrate) .Marland Kitchen.. 1 1/2 Tab Daily 4)  Coreg 25 Mg  Tabs (Carvedilol) .... Take 1 Tablet By Mouth Two Times A Day 5)  Nitroglycerin 0.4 Mg  Subl (Nitroglycerin) .Marland Kitchen.. 1 Under Tongue Ever 5 Min X3 As Needed For Chest Pain 6)  Mucinex Dm 30-600 Mg  Xr12h-Tab (Dextromethorphan-Guaifenesin) .Marland Kitchen.. 1 Tab Every 12 Hours As Needed 7)  Furosemide 40 Mg Tabs (Furosemide) .Marland Kitchen.. 1 Once Daily 8)  Aciphex 20 Mg Tbec (Rabeprazole Sodium) .Marland Kitchen.. 1 Tab Once Daily 9)  Finasteride 5 Mg Tabs (Finasteride) .Marland Kitchen.. 1 Tab Once Daily 10)  Zantac 150 Mg Tabs (Ranitidine Hcl) .Marland Kitchen.. 1 Tab Two Times A Day 11)  Advair Diskus 250-50 Mcg/dose Aepb (Fluticasone-Salmeterol) .Marland Kitchen.. 1 Puff Two Times A Day 12)  Centrum  Tabs (Multiple Vitamins-Minerals) .... Once Daily 13)  Aspirin 81 Mg Tbec (Aspirin) .... Take One Tablet By Mouth Daily 14)  Calcium-Vitamin D 250-125 Mg-Unit Tabs (Calcium Carbonate-Vitamin D) .... Once Daily 15)  Calcium-Vitamin D 250-125 Mg-Unit Tabs (Calcium Carbonate-Vitamin D) .... Once Daily  Allergies (verified): No Known Drug Allergies  Past History:  Past Medical History: Last updated: 01/04/2011 ASTHMA (ICD-493.90) Coronary disease   a.  s/p CABG 1996   b.  Myoview 2007: Minimal peri-infarct ischemia, EF 60% Echo October 2010:EF 50-55%; mild  LVH; inf HK; mild AS, Clayton; mild BAE; PASP 40 Atrial fibrillation paroxysmal  - s/p pacer Bradycardia Hypertension Hyperlipidemia AAA status post endovascular repair 12/23/10 orthostatic hypotension Dysphagia NOS  - hx sounds like achalasia  - recollects woirkup with Dr. Ewing Schlein  - chropnic. Liquid dysphagia worse than solids  Past Surgical History: Last updated: 01/02/2011 CABG 1996 PPM 2/02 Medtronic Sigma Right inguinal hernia repair 7.10.07  PROCEDURES PERFORMED: 12/22/2010   1. Percutaneous endovascular aneurysm repair.   2. Ultrasound-guided percutaneous bilateral common femoral artery       access.   3. Catheter in aorta x2   4. Abdominal angiogram.   5. Distal extension.      SURGEON:   1. Clayton Cal IV, MD   Family History: Last updated: 11/26/2008 non-contributory   Social History: Last updated: 01/22/2009 No tobacco in 20 years Married  Tobacco Use - No.  Alcohol Use - yes social drinker Drug Use - no  Risk Factors: Smoking Status: quit > 6 months (09/19/2010)   Review of Systems       negative other than HPI  Vital Signs:  Patient profile:   77 year old male Height:      69 inches Weight:      144.50 pounds BMI:     21.42 Pulse rate:   66 / minute BP sitting:   94 / 58  (left arm) Cuff size:   regular  Vitals Entered By: Caralee Ates CMA (January 26, 2011 10:59 AM)  Physical Exam  General:  NAD Head:  normocephalic and atraumatic Eyes:  PERRLA/EOM intact; conjunctiva and lids normal. Neck:  Neck supple, no JVD. No masses, thyromegaly or abnormal cervical nodes. Lungs:  Clear bilaterally to auscultation and percussion. Heart:  RRR, NLS1 AND S2 SM Abdomen:  NO bruit or tenderness Msk:  decreased ROM.   Pulses:  pulses normal in all 4 extremities Extremities:  trace left pedal edema and trace right pedal edema.   Neurologic:  Alert and oriented x 3. Skin:  Intact without lesions or rashes. Psych:  Normal affect.   Problems:  Medical  Problems Added: 1)  Dx of Abdominal Aortic Aneurysm  (ICD-441.4)  PPM Specifications Following MD:  Clayton Bunting, MD     PPM Vendor:  Medtronic     PPM Model Number:  [id number]     PPM Serial Number:  [id number] PPM DOI:  01/09/2001     PPM Implanting MD:  Clayton Bunting, MD  Lead 1    Location: [facility]     DOI: 01/09/2001     Model #: [id number]     Serial #: [id number]     Status: active Lead 2    Location: [facility]     DOI: 01/09/2001     Model #: [id number]     Serial #: [id number]     Status: active  Magnet Response Rate:  BOL 85 ERI 65  Indications:  AFIB   PPM Follow Up Pacer Dependent:  No      Episodes Coumadin:  Yes  Parameters Mode:  DDIR     Lower Rate Limit:  60     Upper Rate Limit:  120 Paced AV Delay:  180     Impression & Recommendations:  Problem # 1:  CHEST PAIN (ICD-786.50) Assessment Improved  His updated medication list for this problem includes:    Coumadin 5 Mg Tabs (Warfarin sodium) .Marland Kitchen... Take as directed per coumadin clinic    Imdur 60 Mg Xr24h-tab (Isosorbide mononitrate) .Marland Kitchen... 1 1/2 tab daily    Coreg 25 Mg Tabs (Carvedilol) .Marland Kitchen... Take 1 tablet by mouth two times a day    Nitroglycerin 0.4 Mg Subl (Nitroglycerin) .Marland Kitchen... 1 under tongue ever 5 min x3 as needed for chest pain    Aspirin 81 Mg Tbec (Aspirin) .Marland Kitchen... Take one tablet by mouth daily  Problem # 2:  CORONARY ARTERY DISEASE (ICD-414.00)  His updated medication list for this problem includes:    Coumadin 5 Mg Tabs (Warfarin sodium) .Marland Kitchen... Take as directed per coumadin clinic    Imdur 60 Mg Xr24h-tab (Isosorbide mononitrate) .Marland Kitchen... 1 1/2 tab daily    Coreg 25 Mg Tabs (Carvedilol) .Marland Kitchen... Take 1 tablet by mouth two times a day    Nitroglycerin 0.4 Mg Subl (Nitroglycerin) .Marland Kitchen... 1 under tongue ever 5 min x3 as needed for chest pain    Aspirin 81 Mg Tbec (Aspirin) .Marland Kitchen... Take one tablet by mouth daily  Orders: EKG w/ Interpretation (93000)  Problem # 3:  ABDOMINAL AORTIC ANEURYSM (ICD-441.4) Assessment: Improved S/P  Endovascular repair  Patient Instructions: 1)  Your physician recommends that you schedule a follow-up appointment in: 6 months with Dr. Daleen Squibb 2)  Your physician recommends that you continue on your current medications as directed. Please refer to the Current Medication list given to you today.

## 2011-02-06 ENCOUNTER — Ambulatory Visit
Admission: RE | Admit: 2011-02-06 | Discharge: 2011-02-06 | Disposition: A | Discharge status: Home / Self Care | Payer: Medicare Other | Source: Ambulatory Visit | Attending: Surgery | Admitting: Surgery

## 2011-02-06 ENCOUNTER — Ambulatory Visit (INDEPENDENT_AMBULATORY_CARE_PROVIDER_SITE_OTHER): Payer: Medicare Other | Admitting: Surgery

## 2011-02-06 DIAGNOSIS — I714 Abdominal aortic aneurysm, without rupture: Secondary | ICD-10-CM

## 2011-02-06 MED ORDER — IOHEXOL 350 MG/ML SOLN
80.0000 mL | Freq: Once | INTRAVENOUS | Status: AC | PRN
  Administered 2011-02-06: 80 mL via INTRAVENOUS

## 2011-02-06 NOTE — Assessment & Plan Note (Signed)
OFFICE VISIT  [name], [name] DOB:  [birth year]                                       02/06/2011 OVFIE#:[id number]  Patient comes back today to review his CT scan.  He is status post percutaneous endovascular aneurysm repair on December 21, 2010.  He at that time had a symptomatic 6.5 cm infrarenal aneurysm.  His pain resolved upon stent graft placement.  He comes in today for a CT scan.  On CT scan, he has a type 2 endoleak.  The aneurysm is the same size.  I discussed the significance of the endoleak.  I will plan on repeating his CT scan in 3 months.  He understands that he is still at risk for rupture, albeit very low, with a type 2 endoleak.  He was instructed on what to do should symptoms occur.  We also discussed the possibility of an intervention for the endoleak if his aneurysm gets bigger.  Again, I will see him back in 3 months.    [name], MD Electronically Signed  VWB/MEDQ  D:  02/06/2011  T:  02/06/2011  Job:  [id number]

## 2011-02-10 ENCOUNTER — Ambulatory Visit (HOSPITAL_COMMUNITY)
Admission: RE | Admit: 2011-02-10 | Discharge: 2011-02-10 | Disposition: A | Discharge status: Home / Self Care | Payer: Medicare Other | Source: Ambulatory Visit | Attending: Chiropractic Medicine | Admitting: Chiropractic Medicine

## 2011-02-10 ENCOUNTER — Other Ambulatory Visit (HOSPITAL_COMMUNITY): Payer: Self-pay | Admitting: Chiropractic Medicine

## 2011-02-10 DIAGNOSIS — M545 Low back pain, unspecified: Secondary | ICD-10-CM | POA: Insufficient documentation

## 2011-02-10 DIAGNOSIS — M5137 Other intervertebral disc degeneration, lumbosacral region: Secondary | ICD-10-CM | POA: Insufficient documentation

## 2011-02-10 DIAGNOSIS — M549 Dorsalgia, unspecified: Secondary | ICD-10-CM

## 2011-02-10 DIAGNOSIS — M19019 Primary osteoarthritis, unspecified shoulder: Secondary | ICD-10-CM | POA: Insufficient documentation

## 2011-02-10 DIAGNOSIS — M25519 Pain in unspecified shoulder: Secondary | ICD-10-CM | POA: Insufficient documentation

## 2011-02-10 DIAGNOSIS — M51379 Other intervertebral disc degeneration, lumbosacral region without mention of lumbar back pain or lower extremity pain: Secondary | ICD-10-CM | POA: Insufficient documentation

## 2011-02-10 DIAGNOSIS — M8448XA Pathological fracture, other site, initial encounter for fracture: Secondary | ICD-10-CM | POA: Insufficient documentation

## 2011-02-16 ENCOUNTER — Encounter: Payer: Medicare Other | Admitting: *Deleted

## 2011-02-19 ENCOUNTER — Emergency Department (HOSPITAL_COMMUNITY): Payer: Medicare Other

## 2011-02-19 ENCOUNTER — Emergency Department (HOSPITAL_COMMUNITY)
Admission: EM | Admit: 2011-02-19 | Discharge: 2011-02-19 | Disposition: A | Discharge status: Home / Self Care | Payer: Medicare Other | Attending: Emergency Medicine | Admitting: Emergency Medicine

## 2011-02-19 ENCOUNTER — Encounter (HOSPITAL_COMMUNITY): Payer: Self-pay | Admitting: Radiology

## 2011-02-19 DIAGNOSIS — I714 Abdominal aortic aneurysm, without rupture, unspecified: Secondary | ICD-10-CM | POA: Insufficient documentation

## 2011-02-19 DIAGNOSIS — Z9889 Other specified postprocedural states: Secondary | ICD-10-CM | POA: Insufficient documentation

## 2011-02-19 DIAGNOSIS — R109 Unspecified abdominal pain: Secondary | ICD-10-CM | POA: Insufficient documentation

## 2011-02-19 DIAGNOSIS — J3489 Other specified disorders of nose and nasal sinuses: Secondary | ICD-10-CM | POA: Insufficient documentation

## 2011-02-19 DIAGNOSIS — R05 Cough: Secondary | ICD-10-CM | POA: Insufficient documentation

## 2011-02-19 DIAGNOSIS — K4090 Unilateral inguinal hernia, without obstruction or gangrene, not specified as recurrent: Secondary | ICD-10-CM | POA: Insufficient documentation

## 2011-02-19 DIAGNOSIS — M549 Dorsalgia, unspecified: Secondary | ICD-10-CM | POA: Insufficient documentation

## 2011-02-19 DIAGNOSIS — M79609 Pain in unspecified limb: Secondary | ICD-10-CM

## 2011-02-19 DIAGNOSIS — R059 Cough, unspecified: Secondary | ICD-10-CM | POA: Insufficient documentation

## 2011-02-19 DIAGNOSIS — R110 Nausea: Secondary | ICD-10-CM | POA: Insufficient documentation

## 2011-02-19 LAB — DIFFERENTIAL
Eosinophils Absolute: 0.2 10*3/uL (ref 0.0–0.7)
Lymphocytes Relative: 13 % (ref 12–46)
Lymphs Abs: 1.7 10*3/uL (ref 0.7–4.0)
Monocytes Relative: 11 % (ref 3–12)
Neutro Abs: 9.9 10*3/uL — ABNORMAL HIGH (ref 1.7–7.7)
Neutrophils Relative %: 75 % (ref 43–77)

## 2011-02-19 LAB — CBC
HCT: 36 % — ABNORMAL LOW (ref 39.0–52.0)
Hemoglobin: 11.9 g/dL — ABNORMAL LOW (ref 13.0–17.0)
MCH: 32.2 pg (ref 26.0–34.0)
MCV: 97.6 fL (ref 78.0–100.0)
Platelets: 219 10*3/uL (ref 150–400)
RBC: 3.69 MIL/uL — ABNORMAL LOW (ref 4.22–5.81)
WBC: 13.1 10*3/uL — ABNORMAL HIGH (ref 4.0–10.5)

## 2011-02-19 LAB — COMPREHENSIVE METABOLIC PANEL
Albumin: 3.2 g/dL — ABNORMAL LOW (ref 3.5–5.2)
Alkaline Phosphatase: 95 U/L (ref 39–117)
BUN: 30 mg/dL — ABNORMAL HIGH (ref 6–23)
CO2: 24 mEq/L (ref 19–32)
Chloride: 99 mEq/L (ref 96–112)
Creatinine, Ser: 1.14 mg/dL (ref 0.4–1.5)
GFR calc non Af Amer: 60 mL/min — ABNORMAL LOW (ref >60)
Potassium: 4.1 mEq/L (ref 3.5–5.1)
Total Bilirubin: 0.9 mg/dL (ref 0.3–1.2)

## 2011-02-19 LAB — POCT I-STAT, CHEM 8
BUN: 33 mg/dL — ABNORMAL HIGH (ref 6–23)
Calcium, Ion: 1.1 mmol/L — ABNORMAL LOW (ref 1.12–1.32)
Chloride: 102 mEq/L (ref 96–112)
Creatinine, Ser: 1.3 mg/dL (ref 0.4–1.5)
Glucose, Bld: 114 mg/dL — ABNORMAL HIGH (ref 70–99)
TCO2: 27 mmol/L (ref 0–100)

## 2011-02-19 LAB — URINALYSIS, ROUTINE W REFLEX MICROSCOPIC
Bilirubin Urine: NEGATIVE
Hgb urine dipstick: NEGATIVE
Nitrite: NEGATIVE
Protein, ur: NEGATIVE mg/dL
Urobilinogen, UA: 1 mg/dL (ref 0.0–1.0)

## 2011-02-19 LAB — POCT CARDIAC MARKERS: Troponin i, poc: <0.05 ng/mL (ref 0.00–0.09)

## 2011-02-19 MED ORDER — IOHEXOL 300 MG/ML  SOLN
80.0000 mL | Freq: Once | INTRAMUSCULAR | Status: AC | PRN
  Administered 2011-02-19: 80 mL via INTRAVENOUS

## 2011-02-27 ENCOUNTER — Emergency Department (HOSPITAL_COMMUNITY): Payer: Medicare Other

## 2011-02-27 ENCOUNTER — Inpatient Hospital Stay (HOSPITAL_COMMUNITY)
Admission: EM | Admit: 2011-02-27 | Discharge: 2011-03-03 | DRG: 982 | Disposition: A | Discharge status: Home Health | Payer: Medicare Other | Attending: Family Medicine | Admitting: Family Medicine

## 2011-02-27 ENCOUNTER — Inpatient Hospital Stay (HOSPITAL_COMMUNITY): Payer: Medicare Other

## 2011-02-27 ENCOUNTER — Encounter: Payer: Medicare Other | Admitting: *Deleted

## 2011-02-27 DIAGNOSIS — I2510 Atherosclerotic heart disease of native coronary artery without angina pectoris: Secondary | ICD-10-CM | POA: Diagnosis present

## 2011-02-27 DIAGNOSIS — I2589 Other forms of chronic ischemic heart disease: Secondary | ICD-10-CM | POA: Diagnosis present

## 2011-02-27 DIAGNOSIS — J45901 Unspecified asthma with (acute) exacerbation: Secondary | ICD-10-CM | POA: Diagnosis present

## 2011-02-27 DIAGNOSIS — K219 Gastro-esophageal reflux disease without esophagitis: Secondary | ICD-10-CM | POA: Diagnosis present

## 2011-02-27 DIAGNOSIS — H548 Legal blindness, as defined in USA: Secondary | ICD-10-CM | POA: Diagnosis present

## 2011-02-27 DIAGNOSIS — H3530 Unspecified macular degeneration: Secondary | ICD-10-CM | POA: Diagnosis present

## 2011-02-27 DIAGNOSIS — E785 Hyperlipidemia, unspecified: Secondary | ICD-10-CM | POA: Diagnosis present

## 2011-02-27 DIAGNOSIS — Z7901 Long term (current) use of anticoagulants: Secondary | ICD-10-CM

## 2011-02-27 DIAGNOSIS — Z951 Presence of aortocoronary bypass graft: Secondary | ICD-10-CM

## 2011-02-27 DIAGNOSIS — Z950 Presence of cardiac pacemaker: Secondary | ICD-10-CM

## 2011-02-27 DIAGNOSIS — D649 Anemia, unspecified: Secondary | ICD-10-CM | POA: Diagnosis present

## 2011-02-27 DIAGNOSIS — R197 Diarrhea, unspecified: Principal | ICD-10-CM | POA: Diagnosis present

## 2011-02-27 DIAGNOSIS — I10 Essential (primary) hypertension: Secondary | ICD-10-CM | POA: Diagnosis present

## 2011-02-27 DIAGNOSIS — W19XXXA Unspecified fall, initial encounter: Secondary | ICD-10-CM | POA: Diagnosis present

## 2011-02-27 DIAGNOSIS — IMO0002 Reserved for concepts with insufficient information to code with codable children: Secondary | ICD-10-CM

## 2011-02-27 DIAGNOSIS — Z860100 Personal history of colon polyps, unspecified: Secondary | ICD-10-CM

## 2011-02-27 DIAGNOSIS — R7309 Other abnormal glucose: Secondary | ICD-10-CM | POA: Diagnosis present

## 2011-02-27 DIAGNOSIS — S32009A Unspecified fracture of unspecified lumbar vertebra, initial encounter for closed fracture: Secondary | ICD-10-CM | POA: Diagnosis present

## 2011-02-27 DIAGNOSIS — I4891 Unspecified atrial fibrillation: Secondary | ICD-10-CM | POA: Diagnosis present

## 2011-02-27 DIAGNOSIS — Z7982 Long term (current) use of aspirin: Secondary | ICD-10-CM

## 2011-02-27 LAB — DIFFERENTIAL
Eosinophils Absolute: 0 10*3/uL (ref 0.0–0.7)
Eosinophils Relative: 0 % (ref 0–5)
Lymphs Abs: 1.1 10*3/uL (ref 0.7–4.0)
Monocytes Absolute: 1.2 10*3/uL — ABNORMAL HIGH (ref 0.1–1.0)
Monocytes Relative: 10 % (ref 3–12)

## 2011-02-27 LAB — URINALYSIS, ROUTINE W REFLEX MICROSCOPIC
Glucose, UA: NEGATIVE mg/dL
Hgb urine dipstick: NEGATIVE
Ketones, ur: NEGATIVE mg/dL
Protein, ur: NEGATIVE mg/dL
Urobilinogen, UA: 0.2 mg/dL (ref 0.0–1.0)

## 2011-02-27 LAB — COMPREHENSIVE METABOLIC PANEL
AST: 26 U/L (ref 0–37)
BUN: 20 mg/dL (ref 6–23)
CO2: 29 mEq/L (ref 19–32)
Chloride: 101 mEq/L (ref 96–112)
Creatinine, Ser: 1.13 mg/dL (ref 0.4–1.5)
GFR calc Af Amer: >60 mL/min (ref >60)
GFR calc non Af Amer: >60 mL/min (ref >60)
Glucose, Bld: 110 mg/dL — ABNORMAL HIGH (ref 70–99)
Total Bilirubin: 0.8 mg/dL (ref 0.3–1.2)

## 2011-02-27 LAB — CBC
MCH: 31 pg (ref 26.0–34.0)
MCHC: 32.5 g/dL (ref 30.0–36.0)
MCV: 95.3 fL (ref 78.0–100.0)
Platelets: 260 10*3/uL (ref 150–400)
RDW: 14.9 % (ref 11.5–15.5)

## 2011-02-27 LAB — LIPASE, BLOOD: Lipase: 21 U/L (ref 11–59)

## 2011-02-27 LAB — OCCULT BLOOD, POC DEVICE: Fecal Occult Bld: POSITIVE

## 2011-02-27 MED ORDER — IOHEXOL 300 MG/ML  SOLN
80.0000 mL | Freq: Once | INTRAMUSCULAR | Status: AC | PRN
  Administered 2011-02-27: 80 mL via INTRAVENOUS

## 2011-02-28 ENCOUNTER — Inpatient Hospital Stay (HOSPITAL_COMMUNITY): Payer: Medicare Other

## 2011-02-28 LAB — CBC
HCT: 31.6 % — ABNORMAL LOW (ref 39.0–52.0)
MCV: 95.8 fL (ref 78.0–100.0)
Platelets: 209 10*3/uL (ref 150–400)
RBC: 3.3 MIL/uL — ABNORMAL LOW (ref 4.22–5.81)
RDW: 14.9 % (ref 11.5–15.5)
WBC: 8.7 10*3/uL (ref 4.0–10.5)

## 2011-02-28 LAB — COMPREHENSIVE METABOLIC PANEL
ALT: 13 U/L (ref 0–53)
Albumin: 2.5 g/dL — ABNORMAL LOW (ref 3.5–5.2)
Alkaline Phosphatase: 79 U/L (ref 39–117)
BUN: 17 mg/dL (ref 6–23)
Chloride: 103 mEq/L (ref 96–112)
Potassium: 4.1 mEq/L (ref 3.5–5.1)
Sodium: 138 mEq/L (ref 135–145)
Total Bilirubin: 0.8 mg/dL (ref 0.3–1.2)
Total Protein: 5.8 g/dL — ABNORMAL LOW (ref 6.0–8.3)

## 2011-02-28 LAB — GIARDIA/CRYPTOSPORIDIUM SCREEN(EIA)
Cryptosporidium Screen (EIA): NEGATIVE
Giardia Screen - EIA: NEGATIVE

## 2011-02-28 LAB — CARDIAC PANEL(CRET KIN+CKTOT+MB+TROPI)
CK, MB: 1.9 ng/mL (ref 0.3–4.0)
Total CK: 36 U/L (ref 7–232)
Troponin I: 0.03 ng/mL (ref 0.00–0.06)

## 2011-02-28 LAB — CLOSTRIDIUM DIFFICILE BY PCR: Toxigenic C. Difficile by PCR: NEGATIVE

## 2011-02-28 LAB — PROTIME-INR: Prothrombin Time: 22.7 seconds — ABNORMAL HIGH (ref 11.6–15.2)

## 2011-03-01 ENCOUNTER — Inpatient Hospital Stay (HOSPITAL_COMMUNITY): Payer: Medicare Other

## 2011-03-01 LAB — BASIC METABOLIC PANEL
BUN: 22 mg/dL (ref 6–23)
CO2: 27 mEq/L (ref 19–32)
Chloride: 105 mEq/L (ref 96–112)
Creatinine, Ser: 1.14 mg/dL (ref 0.4–1.5)
Potassium: 4 mEq/L (ref 3.5–5.1)

## 2011-03-01 LAB — CBC
HCT: 31.9 % — ABNORMAL LOW (ref 39.0–52.0)
Hemoglobin: 10.6 g/dL — ABNORMAL LOW (ref 13.0–17.0)
MCH: 31.6 pg (ref 26.0–34.0)
MCHC: 33.2 g/dL (ref 30.0–36.0)
MCV: 95.2 fL (ref 78.0–100.0)
RDW: 14.8 % (ref 11.5–15.5)

## 2011-03-01 LAB — PROTIME-INR: INR: 1.23 (ref 0.00–1.49)

## 2011-03-01 MED ORDER — IOHEXOL 300 MG/ML  SOLN
50.0000 mL | Freq: Once | INTRAMUSCULAR | Status: AC | PRN
  Administered 2011-03-01: 5 mL via INTRAVENOUS

## 2011-03-02 ENCOUNTER — Inpatient Hospital Stay (HOSPITAL_COMMUNITY): Payer: Medicare Other

## 2011-03-02 LAB — FECAL LACTOFERRIN, QUANT

## 2011-03-02 LAB — FECAL FAT, QUALITATIVE
Free fatty acids: NORMAL
Neutral Fat: NORMAL

## 2011-03-03 LAB — CROSSMATCH: Unit division: 0

## 2011-03-03 LAB — PROTIME-INR
INR: 1.1 (ref 0.00–1.49)
Prothrombin Time: 14.4 seconds (ref 11.6–15.2)

## 2011-03-03 LAB — STOOL CULTURE

## 2011-03-07 ENCOUNTER — Other Ambulatory Visit (HOSPITAL_COMMUNITY): Payer: Self-pay | Admitting: Interventional Radiology

## 2011-03-07 DIAGNOSIS — Z9889 Other specified postprocedural states: Secondary | ICD-10-CM

## 2011-03-08 ENCOUNTER — Ambulatory Visit (INDEPENDENT_AMBULATORY_CARE_PROVIDER_SITE_OTHER): Payer: Medicare Other | Admitting: *Deleted

## 2011-03-08 DIAGNOSIS — I4891 Unspecified atrial fibrillation: Secondary | ICD-10-CM

## 2011-03-14 ENCOUNTER — Other Ambulatory Visit: Payer: Self-pay | Admitting: Surgery

## 2011-03-14 DIAGNOSIS — I714 Abdominal aortic aneurysm, without rupture: Secondary | ICD-10-CM

## 2011-03-15 ENCOUNTER — Ambulatory Visit (HOSPITAL_COMMUNITY)
Admission: RE | Admit: 2011-03-15 | Discharge: 2011-03-15 | Disposition: A | Discharge status: Home / Self Care | Payer: Medicare Other | Source: Ambulatory Visit | Attending: Interventional Radiology | Admitting: Interventional Radiology

## 2011-03-15 DIAGNOSIS — Z9889 Other specified postprocedural states: Secondary | ICD-10-CM

## 2011-03-15 NOTE — Discharge Summary (Signed)
NAMEABDULLA, Clayton Gordon                ACCOUNT NO.:  [id number]  MEDICAL RECORD NO.:  [id number]           PATIENT TYPE:  I  LOCATION:  5523                         FACILITY:  [facility]  PHYSICIAN:  Brendia Sacks, MD    DATE OF BIRTH:  [birth year]  DATE OF ADMISSION:  02/27/2011 DATE OF DISCHARGE:  03/03/2011                              DISCHARGE SUMMARY   PRIMARY CARE PHYSICIAN:  Hal T. Pete Glatter, MD  PRIMARY GASTROENTEROLOGIST:  Petra Kuba, MD  RADIOLOGIST:  Dr. Corliss Skains.  CONDITION ON DISCHARGE:  Improved.  DISCHARGE DIAGNOSES: 1. Back pain secondary to acute compression fracture, failed     conservative therapy. 2. Status post vertebroplasty. 3. L2 pedicle fracture bilaterally, supportive care. 4. Chronic diarrhea with some improvement. 5. Atrial fibrillation, stable.  HISTORY OF PRESENT ILLNESS:  This is a 77 year old man who presented to the emergency room with worsening back pain after a fall on February 10, 2011, as well as persistent diarrhea present for over a year.  HOSPITAL COURSE: 1. Uncontrolled back pain.  The patient was admitted for pain control     and further evaluation for his acute L2 compression fracture.  He     underwent successful vertebroplasty on March 02, 2011, with good     improvement.  He is ambulating with physical therapy today and felt     to be stable to return home with home health physical therapy.  He     will go home with narcotics for pain.  He is overall feeling much     better and ready to go home. 2. L2 pedicle fractures bilaterally.  I discussed the case with Dr.     Jeral Fruit of Neurosurgery.  He did recommend obtaining     flexion/extension views of the lumbar spine to assess for mobility     of the fracture.  These were obtained and showed no definite     evidence of mobility.  I discussed the case again with Dr. Jeral Fruit     today.  He recommends early intervention.  There is no limitation     to the patient's activity.  He may go  about daily activities     without restriction from standpoint of these fractures. The     treatment is pain control with a lumbar corset for comfort. 3. Chronic diarrhea.  Some improvement with cholestyramine.  We will     continue that at this point.  He will follow up with Dr. Ewing Schlein in     approximately two weeks' time for consideration of colonoscopy or     flexible sigmoidoscopy and removal of large polyp. 4. Atrial fibrillation.  This appears to be stable.  The patient     continues on Coumadin.  CONSULTATIONS: 1. Gastroenterology recommendations as above.  It is felt that his     diarrhea is possibly secondary to a polyp.  I have discussed the     case with Dr. Ewing Schlein who will follow up with the patient in the     outpatient setting and there are no issues.  At this point, we are  continuing cholestyramine that seems to be a reasonable option. 2. Dr. Antony Odea from Interventional Radiology.  PROCEDURES:  L2 vertebroplasty March 02, 2011.  Follow up with Dr. Corliss Skains in approximately 2 weeks.  He may ambulate.  There is nodriving and no heavy lifting.  No bending, stooping or picking things up off the floor.  IMAGING: 1. CT of the abdomen and pelvis on February 27, 2011:  Stable aortoiliac     endovascular stent.  No acute abdominal pelvic findings, masses,     lesions, or adenopathy.  Stable, type 2 endovascular leak. 2. Chest x-ray February 28, 2011:  No evidence of acute pulmonary     disease. 3. CT of the lumbar spine February 28, 2011:  Moderately severe acute     fracture of L2.  Both pedicles of L2 are fractured without     significant displacement.  Mild retropulsion into the canal causing     mild spinal stenosis.  No other fractures.  Moderate to advanced     degenerative change throughout the lumbar spine.  Aortoiliac stent     graft with endoleak noted without apparent change. 4. Diagnostic imaging of the lumbar spine with flexion and extension     views March 02, 2011:   Mild retropulsion of L2.  No definite     abnormal movements seen.  MICROBIOLOGY: 1. Stool culture on February 27, 2011, was negative. 2. Fecal lactoferrin on March 01, 2011, was positive.  PERTINENT LABORATORY STUDIES: 1. Clostridium difficile toxin was negative. 2. Giardia and Cryptosporidium screens were negative. 3. CBC was notable for hemoglobin of 10.6 which appears to be stable,     white blood cell count stable at 13.2.   4. Discharge INR is 1.10.  We will continue with normal Coumadin     dosing. 5. Basic metabolic panel essentially unremarkable except for mild     hyperglycemia. 6. Hepatic function panel was unremarkable. 7. One set of cardiac enzymes were negative. 8. Urinalysis was negative. 9. Neutral fat was normal in the stool. 10.EKG showing electronic ventricular pacemaker.  EXAMINATION ON DISCHARGE:  GENERAL:  The patient is feeling much better. Back pain is much improved.  He ambulated with physical therapy today 40 feet with a rolling Flye with minimal guarding assistance. VITAL SIGNS:  He remains afebrile.  Temperature is 97.7, pulse 61, respirations 18, blood pressure 123/64, sat 97% on room air.CARDIOVASCULAR:  Regular rate and rhythm.  No murmurs, rubs or gallop. RESPIRATORY:  Clear to auscultation bilaterally.  No wheezes, rales or rhonchi. EXTREMITIES:  No lower extremity edema.  Strength in the bilateral lower extremities is 5/5 and symmetric.  DISCHARGE INSTRUCTIONS:  The patient will be discharged home today with home health physical therapy for home safety eval and treatment. Increase activity slowly using rolling Daloia.  No heavy lifting greater than 10 pounds for 2 weeks.  No bending, stooping or picking things up off the floor for 2 weeks.  I discussed these restrictions with both the patient and his wife.  He has a Band-Aid over the wound on his back, wash with soap and water daily and put on clear Band-Aid for 3 days. Follow up with Dr. Merlene Laughter in 4 weeks, Dr. Corliss Skains in 2 weeks, his office will call, Dr. Ewing Schlein in 3 weeks.  Please call to make an appointment in Dr. Vern Claude office for PT/INR check approximately March 08, 2011.  DISCHARGE MEDICATIONS: 1. Cholestyramine 4 grams p.o. b.i.d. dispense quantity sufficient 1  month.  Take 1 hour before other medications or 4-6 hours after     other medications. 2. Hydrocodone/acetaminophen 5/325 one tablet every 4 hours as needed     for pain #40 no refills. 3. AcipHex 20 mg p.o. at bedtime. 4. Advair 250/50 one puff b.i.d. 5. Aspirin 81 mg p.o. daily. 6. Calcium and vitamin D supplementation daily. 7. Carvedilol 25 mg p.o. b.i.d. with meals. 8. Coumadin 5 mg one half tablet Monday,Wednesday and Saturday     and then a full tablet from Tuesday, Thursday and Friday in the     evenings. 9. Finasteride 5 mg p.o. at bedtime. 10.Lasix 40 mg p.o. daily. 11.Imdur XR 60 mg one and half tablets p.o. daily. 12.Macular Health vitamin over-the-counter p.o. daily. 13.Mucinex 600 mg p.o. b.i.d. as needed for chest congestion. 14.Multivitamin p.o. daily. 15.Nitroglycerin sublingual 0.4 mg p.r.n. chest pain up to three     doses: If no relief call 9-1-1. 16.Pravastatin 20 mg p.o. at bedtime. 17.Restora vitamin over-the-counter p.o. daily. 18.Vitamin C 500 mg p.o. daily. 19.Vitamin D over-the-counter p.o. daily. 20.Zantac 150 mg p.o. b.i.d.  Discontinue oxycodone and acetaminophen after taking Lortab.  THINGS TO FOLLOWUP IN THE OUTPATIENT SETTING: 1. Diarrhea as per Dr. Ewing Schlein. 2. Vertebroplasty as per Dr. Corliss Skains. 3. Atrial fibrillation.  Defer further Coumadin to Dr. Pete Glatter.     Continue to follow with Dr. Myra Gianotti for his abdominal aortic     aneurysm.  Given his advanced age, I could consider risks, benefits for Coumadin but would defer this to his primary care physician who knows him well.  Time coordinating discharge is 45 minutes.     Brendia Sacks,  MD     DG/MEDQ  D:  03/03/2011  T:  03/04/2011  Job:  [id number]  cc:   Hal T. Stoneking, M.D. Petra Kuba, M.D. Dr. Corliss Skains  Electronically Signed by Brendia Sacks  on 03/15/2011 09:48:22 PM

## 2011-03-16 ENCOUNTER — Encounter: Payer: Self-pay | Admitting: Cardiology

## 2011-03-16 NOTE — Telephone Encounter (Signed)
error 

## 2011-03-23 ENCOUNTER — Emergency Department (HOSPITAL_COMMUNITY): Payer: Medicare Other

## 2011-03-23 ENCOUNTER — Observation Stay (HOSPITAL_COMMUNITY)
Admission: EM | Admit: 2011-03-23 | Discharge: 2011-03-27 | Disposition: A | Discharge status: Skilled Nursing Facility | Payer: Medicare Other | Attending: Internal Medicine | Admitting: Internal Medicine

## 2011-03-23 DIAGNOSIS — K219 Gastro-esophageal reflux disease without esophagitis: Secondary | ICD-10-CM | POA: Insufficient documentation

## 2011-03-23 DIAGNOSIS — I2510 Atherosclerotic heart disease of native coronary artery without angina pectoris: Secondary | ICD-10-CM | POA: Insufficient documentation

## 2011-03-23 DIAGNOSIS — X58XXXA Exposure to other specified factors, initial encounter: Secondary | ICD-10-CM | POA: Insufficient documentation

## 2011-03-23 DIAGNOSIS — M545 Low back pain, unspecified: Secondary | ICD-10-CM | POA: Insufficient documentation

## 2011-03-23 DIAGNOSIS — S32009A Unspecified fracture of unspecified lumbar vertebra, initial encounter for closed fracture: Principal | ICD-10-CM | POA: Insufficient documentation

## 2011-03-23 DIAGNOSIS — Z79899 Other long term (current) drug therapy: Secondary | ICD-10-CM | POA: Insufficient documentation

## 2011-03-23 DIAGNOSIS — Z950 Presence of cardiac pacemaker: Secondary | ICD-10-CM | POA: Insufficient documentation

## 2011-03-23 DIAGNOSIS — Z951 Presence of aortocoronary bypass graft: Secondary | ICD-10-CM | POA: Insufficient documentation

## 2011-03-23 DIAGNOSIS — E785 Hyperlipidemia, unspecified: Secondary | ICD-10-CM | POA: Insufficient documentation

## 2011-03-23 DIAGNOSIS — G319 Degenerative disease of nervous system, unspecified: Secondary | ICD-10-CM | POA: Insufficient documentation

## 2011-03-23 DIAGNOSIS — I2589 Other forms of chronic ischemic heart disease: Secondary | ICD-10-CM | POA: Insufficient documentation

## 2011-03-23 DIAGNOSIS — Z87891 Personal history of nicotine dependence: Secondary | ICD-10-CM | POA: Insufficient documentation

## 2011-03-23 DIAGNOSIS — R29898 Other symptoms and signs involving the musculoskeletal system: Secondary | ICD-10-CM | POA: Insufficient documentation

## 2011-03-23 DIAGNOSIS — J45909 Unspecified asthma, uncomplicated: Secondary | ICD-10-CM | POA: Insufficient documentation

## 2011-03-23 DIAGNOSIS — I509 Heart failure, unspecified: Secondary | ICD-10-CM | POA: Insufficient documentation

## 2011-03-23 DIAGNOSIS — I10 Essential (primary) hypertension: Secondary | ICD-10-CM | POA: Insufficient documentation

## 2011-03-23 LAB — DIFFERENTIAL
Basophils Relative: 0 % (ref 0–1)
Lymphs Abs: 1.6 10*3/uL (ref 0.7–4.0)
Monocytes Relative: 11 % (ref 3–12)
Neutro Abs: 4.3 10*3/uL (ref 1.7–7.7)
Neutrophils Relative %: 63 % (ref 43–77)

## 2011-03-23 LAB — BASIC METABOLIC PANEL
BUN: 21 mg/dL (ref 6–23)
Calcium: 9.5 mg/dL (ref 8.4–10.5)
Creatinine, Ser: 0.87 mg/dL (ref 0.4–1.5)
GFR calc Af Amer: >60 mL/min (ref >60)
GFR calc non Af Amer: >60 mL/min (ref >60)

## 2011-03-23 LAB — URINALYSIS, ROUTINE W REFLEX MICROSCOPIC
Glucose, UA: NEGATIVE mg/dL
Hgb urine dipstick: NEGATIVE
Specific Gravity, Urine: 1.023 (ref 1.005–1.030)
Urobilinogen, UA: 0.2 mg/dL (ref 0.0–1.0)

## 2011-03-23 LAB — CBC
MCH: 31.2 pg (ref 26.0–34.0)
MCHC: 32.3 g/dL (ref 30.0–36.0)
MCV: 96.7 fL (ref 78.0–100.0)
Platelets: 185 10*3/uL (ref 150–400)
RBC: 3.65 MIL/uL — ABNORMAL LOW (ref 4.22–5.81)
RDW: 15.1 % (ref 11.5–15.5)

## 2011-03-23 LAB — PROTIME-INR
INR: 2.13 — ABNORMAL HIGH (ref 0.00–1.49)
Prothrombin Time: 24 seconds — ABNORMAL HIGH (ref 11.6–15.2)

## 2011-03-24 ENCOUNTER — Emergency Department (HOSPITAL_COMMUNITY): Payer: Medicare Other

## 2011-03-24 ENCOUNTER — Encounter (HOSPITAL_COMMUNITY): Payer: Self-pay

## 2011-03-24 LAB — COMPREHENSIVE METABOLIC PANEL
ALT: 10 U/L (ref 0–53)
Albumin: 3 g/dL — ABNORMAL LOW (ref 3.5–5.2)
BUN: 17 mg/dL (ref 6–23)
Calcium: 9.1 mg/dL (ref 8.4–10.5)
Glucose, Bld: 89 mg/dL (ref 70–99)
Potassium: 4 mEq/L (ref 3.5–5.1)
Sodium: 138 mEq/L (ref 135–145)
Total Protein: 5.7 g/dL — ABNORMAL LOW (ref 6.0–8.3)

## 2011-03-24 LAB — DIFFERENTIAL
Basophils Absolute: 0 10*3/uL (ref 0.0–0.1)
Eosinophils Absolute: 0.2 10*3/uL (ref 0.0–0.7)
Eosinophils Relative: 2 % (ref 0–5)
Lymphocytes Relative: 20 % (ref 12–46)
Monocytes Absolute: 0.9 10*3/uL (ref 0.1–1.0)

## 2011-03-24 LAB — PROTIME-INR
INR: 1.91 — ABNORMAL HIGH (ref 0.00–1.49)
Prothrombin Time: 22 seconds — ABNORMAL HIGH (ref 11.6–15.2)

## 2011-03-24 LAB — CBC
HCT: 34.9 % — ABNORMAL LOW (ref 39.0–52.0)
MCHC: 32.4 g/dL (ref 30.0–36.0)
Platelets: 158 10*3/uL (ref 150–400)
RDW: 15.1 % (ref 11.5–15.5)
WBC: 7.7 10*3/uL (ref 4.0–10.5)

## 2011-03-25 LAB — PROTIME-INR: INR: 1.98 — ABNORMAL HIGH (ref 0.00–1.49)

## 2011-03-26 LAB — PROTIME-INR
INR: 2.2 — ABNORMAL HIGH (ref 0.00–1.49)
Prothrombin Time: 24.6 seconds — ABNORMAL HIGH (ref 11.6–15.2)

## 2011-03-26 NOTE — H&P (Signed)
[name], Clayton Gordon                ACCOUNT NO.:  [id number]  MEDICAL RECORD NO.:  [id number]           PATIENT TYPE:  I  LOCATION:  5523                         FACILITY:  [facility]  PHYSICIAN:  Eduard Clos, MDDATE OF BIRTH:  Nov 01, 1915  DATE OF ADMISSION:  02/27/2011 DATE OF DISCHARGE:                             HISTORY & PHYSICAL   PRIMARY CARE PHYSICIAN:  Hal T. Stoneking, MD  CHIEF COMPLAINT:  Diarrhea.  HISTORY OF PRESENT ILLNESS:  A 77 year old male with known history of ischemic cardiomyopathy, last cardiac cath done in February 2012, showed an EF of 40-45% with three-vessel coronary artery disease with history of abdominal attic aneurysm status post percutaneous endovascular repair in February 2012, this has been followed by Dr. Myra Gianotti of Vascular Surgery, history of CAD status post CABG, paroxysmal atrial fibrillation status post ICD and pacemaker placement, history of chronic diarrhea, has come to the ER because of worsening diarrhea over the last 1 week. The patient states that he has been having diarrhea for almost many weeks now, but since last week it is worse and multiple episodes and had gone to his PCP, who had him start on Flagyl despite taking which patient still has diarrhea.  The patient does not complain of any abdominal pain, but does have low back pain after he had a fall and x- rays done on February 10, 2011, showed L2 fracture and was supposed to be managed conservatively.  The patient still has severe pain for which patient has already received some Dilaudid in the ER.  At this time, the patient has been admitted because of his constant diarrhea and there was some low back pain.  In addition, the patient has had some episodic breathing difficulties since last night.  He had history of asthma, at this time on exam the patient does have bilateral expiratory wheeze.  The patient denies any chest pain, denies any nausea, vomiting, or any dysuria or  discharge. Denies any focal deficit.  The patient is legally blind due to macular degeneration.  Denies any focal deficit.  PAST MEDICAL HISTORY: 1. Legally blind secondary to macular degeneration. 2. History of CAD status post CABG. 3. History of recent cardiac cath in February showed EF of 40-45% with     three-vessel disease. 4. History of paroxysmal atrial fibrillation status post ablation with     history of bradycardia status post Medtronic dual chamber pacemaker     placement. 5. Recent triple-layer repair percutaneous endovascular on December 21, 2010, with 6.6 cm in diameter. 6. Remote history of tobacco abuse, quit in 1983. 7. History of GERD. 8. History of hypertension. 9. History of hyperlipidemia. 10.History of asthma. 11.History of foot surgery. 12.History of aspiration pneumonia. 13.History of hernia repair, right inguinal.  MEDICATIONS ON ADMISSION: 1. The patient is on Coumadin. 2. Oxycodone. 3. Acetaminophen 7.5 two tablets q.6 p.r.n. 4. Mucinex. 5. Nitroglycerin sublingual p.r.n. 6. Zantac. 7. Vitamin D. 8. Restora. 9. Vitamin C. 10.Pravastatin 20 mg daily. 11.Multivitamin Macular Health. 12.Isosorbide mononitrate XL 60 mg daily. 13.Furosemide 40 mg daily. 14.Advair Diskus 250/50. 15.Simvastatin 5 mg p.o. daily.  16.Coreg 25 mg twice daily. 17.Calcium. 18.Aspirin 81 mg daily. 19.Aciphex.  ALLERGIES:  No known drug allergies.  FAMILY HISTORY:  Nothing contributory.  SOCIAL HISTORY:  He quit smoking more than 20 years ago.  Denies alcohol or drug abuse.  He is a full code.  REVIEW OF SYSTEMS:  As per history of present illness, nothing else significant.  PHYSICAL EXAMINATION:  GENERAL:  The patient examined at bedside not in acute distress. VITAL SIGNS:  Blood pressure is 140/70, pulse 67 per minute, temperature 97.9, respirations 18, O2 sat 96% on room air. HEENT:  Anicteric.  No pallor.  No discharge from ears, eyes, nose,  or mouth. CHEST:  Bilateral air entry present bilaterally expiratory wheeze heard. No crepitations. HEART:  S1 and S2 heard. ABDOMEN:  Soft, nontender.  Bowel sounds heard. CNS:  Alert, awake, and oriented to time, place, and person.  Moves upper and lower extremities.  The patient is legally blind. EXTREMITIES:  Peripheral pulses felt.  There is a bruise in the right flank area which patient states he developed after he had a fall.  At this time, it is not tender.  There is no acute ischemic changes, cyanosis, or clubbing.  LABORATORY DATA:  EKG has been ordered.  CT abdomen and pelvis with contrast done today shows stable aortoiliac endovascular stents, stable type 2 endovascular leak, no change in abdominal aortic aneurysm.  No acute abdominopelvic findings, mass lesions, or adenopathy.  CBC, WBC is 12, hemoglobin is 10.6, hematocrit 32.6, platelets 260, neutrophils 81%. PT/INR is 20.8 and 1.7.  Complete metabolic panel, sodium 136, potassium 4.1, chloride 101, carbon dioxide 29, glucose 110, BUN 20, creatinine 1.1, total bilirubin is 0.8, alkaline phosphatase 97, AST 26, ALT is 16, total protein 6.8, albumin 3.2, calcium 9.4, lipase 21.  UA is negative for nitrites and leukocytes.  Fecal occult blood is positive and x-ray done on February 10, 2011, lumbosacral shows new 25% L2 superior end plate compression fracture without evidence of bony retropulsion, remote mild T12 superior endplate compression fracture, mild-to-moderate multilevel degenerative changes.  ASSESSMENT: 1. Diarrhea, unclear etiology. 2. Recent fall with L2 compression fracture. 3. Shortness of breath probably secondary to asthma exacerbation. 4. Anemia with stable H and H, but guaiac-positive. 5. History of coronary artery disease status post coronary artery     bypass graft with last cardiac cath done in February 2012, with     severe three-vessel disease. 6. Ischemic cardiomyopathy, last EF measured in  February 2012, was 40-     45%. 7. History of abdominal aortic aneurysm status post recent repair     percutaneous endovascular in February 2012.  Recent CT abdomen and     pelvis done today shows stable type 2 endovascular leak, which is     known. 8. History of premature atrial contractions, atrial fibrillation     status post ablation on Coumadin. 9. History of hypertension. 10.History of hyperlipidemia. 11.Legally blind with macular degeneration.  PLAN: 1. At this time, we will admit patient to medical floor. 2. For his diarrhea at this time, I am gently hydrating patient, in     order to hydrate, we will give 1 liter of fluid over many hours and     stop it.  We will get stool studies and at this time I am adding     cholestyramine (Questran).  We will continue his Flagyl and if his     stool is positive for guaiac, I am going to hold Coumadin  until I     make sure there is no severe bleed after which if there is no     definite severe bleeding, we may restart Coumadin, but I will also     consult GI in the morning. 3. Shortness of breath at this time could be from his asthma     exacerbation.  I am going to place patient on Xopenex and     Pulmicort.  I am going to add a BNP and get a cardiac enzyme.  The     patient has no chest pain at this time.  We will also get an EKG,     also order a chest x-ray.  The patient does have mild leukocytosis     until there is definite evidence of pneumonia, and little hesitant     to start any antibiotics at this time for his asthma exacerbation. 4. Low back pain with L2 compression fracture.  I am going to get a CT     lumbosacral spine and we will get a PT/OT consult and we need to     consult Dr. Corliss Skains from Neuroradiology, is to see if the patient     is for any intervention. 5. Further recommendations based on the test order and clinic course.     Eduard Clos, MD     ANK/MEDQ  D:  02/27/2011  T:  02/27/2011  Job:   [id number]  cc:   Hal T. Stoneking, M.D.  Electronically Signed by Midge Minium MD on 03/26/2011 08:15:47 AM

## 2011-03-28 ENCOUNTER — Other Ambulatory Visit: Payer: Self-pay | Admitting: Geriatric Medicine

## 2011-03-28 DIAGNOSIS — R1900 Intra-abdominal and pelvic swelling, mass and lump, unspecified site: Secondary | ICD-10-CM

## 2011-03-28 NOTE — Assessment & Plan Note (Signed)
[facility]                            CARDIOLOGY OFFICE NOTE   [name], [name]                       MRN:          [id number]  DATE:04/15/2008                            DOB:          1915-10-13    Clayton Gordon comes in today for follow-up.  He is 46 and just keeps on  trucking!   PROBLEM LIST:  1. Coronary artery disease.  He is having no angina.  He has normal      left ventricular function.  Stress Myoview was stable in November      2008.  Ejection fraction is 60%.  Prior inferior wall infarct with      minimal peri-infarct ischemia.  We have been treating him      medically.  2. History of paroxysmal atrial fibrillation/flutter, status post      pacemaker.  3. Anticoagulation.  4. Hyperlipidemia.  5. History of orthostatic hypotension.   He has no real complaints today.  He has no orthopnea, PND, peripheral  edema, tachy palpitations, syncope or presyncope.   His meds are unchanged since his last visit.  Please refer to previous  notes and/or the maintenance medication list.  His blood work is  followed by Dr. Ann Maki T. Stoneking, M.D.   PHYSICAL EXAMINATION:  VITAL SIGNS:  Today, his blood pressure is  110/62, his pulse is 76 and regular.  Weight is 144, down 6.  HEENT:  Unchanged.  NECK:  Carotid upstrokes are equal bilaterally without bruits.  No JVD.  Thyroid is not enlarged.  Trachea is midline.  LUNGS:  Clear.  HEART:  Reveals a regular rate and rhythm.  No gallop.  ABDOMEN:  Soft, good bowel sounds.  EXTREMITIES:  No cyanosis, clubbing or edema.  Pulses intact.  NEURO:  Intact.   Clayton Gordon is doing well.  I have made no changes in his medical  program.  I will plan on seeing him back in 6 months.     Clayton C. Daleen Squibb, MD, [facility]  Electronically Signed    TCW/MedQ  DD: 04/15/2008  DT: 04/15/2008  Job #: [id number]   cc:   Hal T. Stoneking, M.D.

## 2011-03-28 NOTE — Assessment & Plan Note (Signed)
[facility]                            CARDIOLOGY OFFICE NOTE   Clayton, Gordon                       MRN:          [id number]  DATE:04/24/2007                            DOB:          [birth year]    HISTORY:  Clayton Gordon returns today for management of the following  issues:  1. Coronary artery disease.  He is having no angina.  His last stress      Myoview on October 10, 2006, an ejection fraction of 60%.  Prior      inferior wall infarction, minimal peri-infarction ischemia.  He is      being treated medically.  2. History of paroxysmal atrial fibrillation and flutter, status post      pacemaker.  3. Anticoagulation.  4. Hyperlipidemia.  5. History of orthostatic hypotension, which has been improved.   He has no complaints today.   MEDICATIONS:  Are unchanged since the last visit.  Please refer to the  maintenance medication list.  He has been seeing Dr. Charlcie Cradle. Delford Field  for some chronic obstructive pulmonary disease and is on Advair.  This  seems to be helping.   PHYSICAL EXAMINATION:  VITAL SIGNS:  Blood pressure 140/75, pulse 73 and  regular.  His Electrocardiogram shows AV sequential pacing.  His weight  is 148 pounds and stable.  HEENT:  Normocephalic and atraumatic.  Pupils equal, round, reactive to  light and accommodation.  Extraocular movements intact.  Sclerae clear.  Facial symmetry is normal.  NECK:  Carotids are full without bruits. There is no jugular venous  distention.  Thyroid is not enlarged.  Trachea is midline.  LUNGS:  Remarkable for decreased breath sounds throughout, with no  rhonchi or wheezes.  HEART:  A slightly displaced PMI.  Normal S1 and S2.  No gallop.  ABDOMEN:  Soft with good bowel sounds.  No midline bruit, no  hepatomegaly.  EXTREMITIES:  No clubbing, cyanosis or edema.  Pulses were only 1+ on  the right, 2+ on the left in the lower extremities.  He denies  claudication.  There is no edema.  He has  some varicose veins.  NEUROLOGIC:  Intact.   ASSESSMENT/PLAN:  Clayton Gordon is doing well.  I have asked him to  continue his current medications.  We renewed his carvedilol 25 mg  b.i.d. for one year.  He will continue his other medications.     Thomas C. Daleen Squibb, MD, [facility]  Electronically Signed    TCW/MedQ  DD: 05/08/2007  DT: 05/08/2007  Job #: [id number]   cc:   Hal T. Stoneking, M.D.

## 2011-03-28 NOTE — Assessment & Plan Note (Signed)
[facility]                            CARDIOLOGY OFFICE NOTE   VIRGINIA, FRANCISCO                       MRN:          [id number]  DATE:10/14/2007                            DOB:          September 21, 1915    Clayton Gordon returns today for management of the following issues:  1. Coronary artery disease.  He is having no angina.  He has normal      left ventricular function by stress Myoview on October 10, 2006.      He has had a prior inferior wall myocardial infarction.  There has      been no ischemia.  2. History of paroxysmal atrial fibrillation/flutter status post      pacemaker.  3. Anticoagulation.  4. Hyperlipidemia.  5. History of orthostatic hypotension which is now improved.   Other than a cold, he has no complaints.   His meds include:  1. Pravachol 20 mg a day.  2. Coumadin 5 mg as directed.  3. Coreg 25 mg p.o. b.i.d.   PHYSICAL EXAMINATION:  VITAL SIGNS:  His blood pressure today is 132/64.  His pulse 76 and regular.  His weight is 150.  HEENT:  Unchanged.  Carotid upstrokes were equal bilaterally without  bruits.  No JVD.  Thyroid is not enlarged.  Trachea is midline.  LUNGS:  Clear.  HEART:  Reveals a regular rate and rhythm without gallop.  ABDOMEN:  Soft.  Good bowel sounds.  EXTREMITIES:  Reveal no edema.  Pulses are present.  NEUROLOGIC:  Grossly intact.   Electrocardiogram was not repeated today.   Clayton Gordon is doing well.  I have made no change in his medical program.  I will see him back in 6 months.     Thomas C. Daleen Squibb, MD, [facility]  Electronically Signed    TCW/MedQ  DD: 10/14/2007  DT: 10/14/2007  Job #: [id number]   cc:   Hal T. Stoneking, M.D.

## 2011-03-28 NOTE — Assessment & Plan Note (Signed)
[facility]                            CARDIOLOGY OFFICE NOTE   AGOSTINO, GORIN                       MRN:          [id number]  DATE:07/31/2007                            DOB:          1915/03/13    [name] comes in today per the request of a PA at the [facility],  saying his weight was up.   PROBLEM LIST:  1. Coronary artery disease:  He is having no angina.  He does have      some dyspnea on exertion on hot, humid days working outside.  This      has not significantly changed.  His last Myoview was October 10, 2005, EF of 60%.  2. History of paroxysmal atrial fibrillation and flutter, status post      permanent pacemaker.  3. Anticoagulation.  4. Hyperlipidemia.  5. History of orthostatic hypotension.   He does not know what all of the fuss is about.  In fact, his weight is  3 pounds down here, compared to the last visit.  He denies any  orthopnea.  He does have baseline peripheral edema in the right lower  extremity.  He has a lot of varicose veins there.   His medications are unchanged since the last visit.  Please refer to the  maintenance medication list.   PHYSICAL EXAMINATION:  He is very pleasant.  His blood pressure is 130/72.  His pulse is 72 and regular.  He is AV  paced by EKG.  The weight is 145 , down 3.  HEENT:  Unchanged.  Carotid upstrokes are equal bilaterally without bruits.  There is no  JVD, even lying at 30 degrees.  LUNGS:  Clear to auscultation.  HEART:  An S4 soft systolic murmur along the left sternal border at the  apex.  ABDOMEN:  Soft.  There is no hepatomegaly.  EXTREMITIES:  No clubbing or cyanosis but only trace to 1+ edema in the  right lower extremity.  He has varicose veins.  There is no sign of DVT.  Pulses are intact.  NEURO:  Intact.   ASSESSMENT/PLAN:  [name] is doing well.  I do not think he has  gained any significant weight nor do I think he has any volume overload.  I have  reassured him, made no changes in  his medical program.  We will see him back in December, as scheduled.  At that time, he will need a stress Myoview.     Thomas C. Daleen Squibb, MD, [facility]  Electronically Signed    TCW/MedQ  DD: 07/31/2007  DT: 08/01/2007  Job #: [id number]   cc:   Hal T. Stoneking, M.D.

## 2011-03-28 NOTE — Discharge Summary (Signed)
Clayton Gordon, Clayton Gordon                ACCOUNT NO.:  [id number]   MEDICAL RECORD NO.:  [id number]          PATIENT TYPE:  INP   LOCATION:  5149                         FACILITY:  [facility]   PHYSICIAN:  Corinna L. Lendell Caprice, MDDATE OF BIRTH:  1915/11/01   DATE OF ADMISSION:  12/19/2007  DATE OF DISCHARGE:  12/21/2007                               DISCHARGE SUMMARY   DISCHARGE DIAGNOSES:  1. Right middle lobe and right lower lobe pneumonia, community      acquired.  2. Resolved hypotension.  3. Stable coronary artery disease.  4. Paroxysmal atrial fibrillation with slightly supratherapeutic      Coumadin.  5. Hyperlipidemia.  6. Gastroesophageal reflux disease.  7. Macular degeneration.  8. History of psoriasis.  9. Benign prostatic hypertrophy.  10.Hypokalemia.   DISCHARGE MEDICATIONS:  1. He is to change his Coumadin to 5 mg alternating with 2 mg.  Please      note that the previous regimen was 5 mg daily except for Tuesdays      and Thursdays which was 2-1/2 mg a day.  2. Tylenol or ibuprofen as needed for fever or myalgias.  3. Azithromycin 250 mg a day until gone.  4. Ceftin 500 mg p.o. b.i.d. until gone.  5. He may continue the rest of his outpatient medications.  Please see      H&P for details.   FOLLOWUP:  He should follow up with Dr. Pete Gordon next week to check INR  and make any adjustments of Coumadin as needed.  He will need a repeat  PA and lateral chest x-ray in 4-6 weeks.   CONDITION:  Stable.   CONSULTATIONS:  None.   PROCEDURES:  None.   DISCHARGE INSTRUCTIONS:  1. Diet should be cardiac.  2. Activity:  No heavy exertion for 1-2 weeks, but otherwise as      tolerated.   LABORATORY DATA:  Blood cultures are negative to date.  CBC on admission  was significant for white blood cell count of 21,000, 89% neutrophils,  4% lymphocytes.  On February 6, his white blood cell count was 11,000.  His platelet count was 135 on admission, hemoglobin 11.5, hematocrit  33.6, INR on admission was 3.0 and on February 6 was 3.1.  After holding  Coumadin for 2 days, his INR is 2.2.  Complete metabolic panel on  admission significant for a glucose of 136, albumin of 2.8.  On February  6, his glucose was normal and potassium dropped to 2.3 and was repleted.  Urinalysis showed a specific gravity 1.025, otherwise negative.   SPECIAL STUDIES/RADIOLOGY:  Portable chest x-ray showed right upper and  lower lobe no pneumonia, chronic lung changes and COPD.  EKG showed AV  pacing.   HISTORY AND HOSPITAL COURSE:  Clayton Gordon is a pleasant, very active 77-  year-old white male from independent living at [facility], who  presented after being seen by Dr. Pete Gordon as an outpatient with  fevers, chills and hypotension.  His systolic blood pressure as an  outpatient was in the 80s.  He received IV fluids and arrived in the  emergency room via EMS.  His blood pressure at that time was 99.  He  reported a sudden cough, fevers and chills the night before admission.  He was found to have right-sided pneumonia.  He was admitted and given  IV fluids.  His cardiac medications were held.  He was started on  antibiotics.  Blood cultures are negative to date.  He made a remarkably  rapid recovery.  His fevers defervesced.  His white count decreased.  He  never had any abnormal lung sounds on exam, and his exam otherwise  remained unremarkable.  At the time of discharge, he was back on his  cardiac medications.  He had been stable on oral antibiotics for a day.  He was eating well, ambulating well and had normal vital signs, and his  Coumadin was held due to his slightly elevated, slightly  supratherapeutic level, especially in light of IV antibiotics.  This can  be resumed at a different dose.  Please see above.  His other medical  problems remained stable during his hospitalization.      Corinna L. Lendell Caprice, MD  Electronically Signed     CLS/MEDQ  D:  12/21/2007  T:   12/23/2007  Job:  [id number]   cc:   Clayton Gordon, M.D.  Clayton C. Daleen Squibb, MD, [facility]  Clayton Gordon, M.D.  Clayton Gordon, M.D.

## 2011-03-28 NOTE — H&P (Signed)
Clayton Gordon, Clayton Gordon                ACCOUNT NO.:  [id number]   MEDICAL RECORD NO.:  [id number]          PATIENT TYPE:  EMS   LOCATION:  MAJO                         FACILITY:  [facility]   PHYSICIAN:  Corinna L. Lendell Caprice, MDDATE OF BIRTH:  25-Jan-1915   DATE OF ADMISSION:  12/19/2007  DATE OF DISCHARGE:                              HISTORY & PHYSICAL   CHIEF COMPLAINT:  Cough and weakness.   HISTORY OF PRESENT ILLNESS:  The patient is a 77 year old white male who  was sent to the emergency room via EMS after being examined by Dr.  Pete Glatter.  His blood pressure in the office was in the 80's systolic.  He started having fevers, chills, and cough last night.  He had his flu  shot this year and apparently he is up-to-date with Pneumovax.  His  blood pressure in the emergency room was 99.  Apparently he had been  given a 500 normal saline bolus prior to coming to the emergency room.  His appetite has been good.  He has no other complaints.   PAST MEDICAL HISTORY:  Coronary artery disease with preserved ejection  fraction, paroxysmal atrial fibrillation with pacemaker, hyperlipidemia,  gastroesophageal reflux disease, benign prostatic hypertrophy, macular  degeneration, history of psoriasis.   MEDICATIONS:  1. Finasteride 5 mg a day.  2. Aciphex 20 mg a day.  3. Advair Diskus 250/50 b.i.d.  4. Coreg 25 mg p.o. b.i.d.  5. Coumadin 5 mg a day.  6. Detrol LA 12 mg a day.  7. Fiber.  8. Laxative.  9. IMDUR 60 mg a day.  10.Lomotil as needed.  11.Osteovite daily.  12.Pravachol 20 mg a day.  13.Systane  to both eyes  twice daily.  14.Calcium with vitamin D.  15.Vitamin E and Vitamin C.   ALLERGIES:  No known drug allergies.   SOCIAL HISTORY:  He is married.  He lives in a cottage at [facility].  He drinks occasionally but does not smoke.   FAMILY HISTORY:  Noncontributory.   REVIEW OF SYSTEMS:  As above, otherwise negative.   PHYSICAL EXAMINATION:  VITAL SIGNS:  Temperature 98.1,  blood pressure  99/52, pulse 64, respiratory rate 20, oxygen saturation 98% on room air.  GENERAL:  The patient is well-nourished, well-developed, in no acute  distress.  HEENT:  Normocephalic and atraumatic.  Pupils equal, round, and reactive  to light.  He has arcus senilis.  He has slightly dry mucous membranes.  Oropharynx is without erythema or exudate.  NECK:  Supple with no lymphadenopathy.  LUNGS:  Clear to auscultation bilaterally without wheezes, rhonchi, or  rales.  CARDIOVASCULAR:  Regular rate and rhythm without murmurs, rubs, or  gallops.  ABDOMEN:  Normal bowel sounds, soft, nontender, and nondistended.  GENITOURINARY:  RECTAL:  Deferred.  EXTREMITIES:  No cyanosis, clubbing, or edema.  SKIN:  No rash.  PSYCHIATRIC:  Normal affect, calm, and cooperative.  NEUROLOGY:  Alert and oriented.  Cranial nerves II-XII grossly intact.  Sensory and motor examination are intact.   LABORATORY DATA:  White blood cell count 21,000, hemoglobin 11.5,  hematocrit 33.6, platelet  count 135.  89% neutrophils, 4% lymphocytes.  Complete metabolic panel significant for glucose of 136, total protein  5.8, albumin 2.8, otherwise unremarkable.   EKG shows atrial fibrillation with ventricular pacing.   Chest x-ray shows right middle lobe and right lower lobe infiltrates.   ASSESSMENT:  1. Right middle lobe and right lower lobe pneumonia with resolved      hypotension.  The patient will be admitted.  He has received a dose      of Rocephin and Azithromycin in the emergency room which I will      continue.  I will order blood cultures.  He will continue to get IV      fluids.  I will hold his IMDUR and Coreg for now.  2. Stable coronary artery disease.  3. Paroxysmal atrial fibrillation with pacemaker.  I will check INR      and continue Coumadin.  4. Hyperlipidemia.  5. Gastroesophageal reflux disease.  6. Benign prostatic hypertrophy.  7. Macular degeneration.  8. History of  psoriasis.      Corinna L. Lendell Caprice, MD  Electronically Signed     CLS/MEDQ  D:  12/19/2007  T:  12/20/2007  Job:  [id number]   cc:   Hal T. Stoneking, M.D.  Thomas C. Daleen Squibb, MD, [facility]  Petra Kuba, M.D.  Bertram Millard. Dahlstedt, M.D.

## 2011-03-28 NOTE — Assessment & Plan Note (Signed)
[facility]                            CARDIOLOGY OFFICE NOTE   FREDERIC, TONES                       MRN:          [id number]  DATE:10/28/2008                            DOB:          1915/07/27    Clayton Gordon comes in today for followup.   He was recently diagnosed by Dr. Pete Glatter to have right upper lobe  pneumonia.  The first antibiotic he was on, which he cannot remember the  name, he had some reaction to.  He subsequently start another antibiotic  which he cannot remember the name of as well.  He is on Coumadin, but  his INR is 2.6 today.   His biggest complaint is some swelling in his right lower extremity and  some tenderness in his leg.  He says it was started about a couple of  days ago, but is better now.   PROBLEM LIST:  1. Coronary artery disease.  He has normal left ventricular function.      His ejection fraction is 60%.  He has a previous inferior wall      infarct with minimal peri-infarct ischemia.  His last stress      Myoview was in November 2008.  2. History of paroxysmal atrial fibrillation and flutter, status post      pacer.  He is AV-paced today with and without the magnet.  3. Anticoagulation.  4. Hyperlipidemia.  5. History orthostatic hypotension.   He denies hemoptysis, any pleuritic chest pain and any shortness of  breath.   CURRENT MEDICATIONS:  1. Lomotil 2.5 mg 2 q.a.m.  2. Ocuvite.  3. Pravachol 20 mg nightly.  4. Zantac 150 mg p.o. b.i.d.  5. Proscar 5 mg daily.  6. Coumadin as directed.  7. Multivitamin.  8. Vitamin C.  9. Calcium 600 mg per day.  10.Vitamin E.  11.Imdur 60 mg a day.  12.Aciphex 20 mg a day.  13.Advair Diskus 250/50 one puff b.i.d.  14.Coreg 25 mg per day.   PHYSICAL EXAMINATION:  VITAL SIGNS:  His blood pressure is 118/66, his  pulse is 82 and regular.  His weight is 150 which is actually up 8  pounds.  GENERAL:  He is in no acute distress.  HEENT:  Baseline.  NECK:   Carotid upstrokes were equal bilaterally without bruits.  Thyroid  is not enlarged.  Trachea is midline.  LUNGS:  Clear without rub.  He does have decreased breath sounds in the  right upper lobe anteriorly.  HEART:  Nondisplaced PMI.  He has normal  S1 and S2.  ABDOMEN:  Soft.  Good bowel sounds.  No midline bruit.  EXTREMITIES:  A 2+ pitting edema and the right lower extremity has some  calf tenderness.  No definite clot was felt.  Pulses were palpable in  both lower extremities.  There is no edema in the left lower extremity.  He has chronic venous insufficiency on both legs.  NEUROLOGIC:  Grossly intact.   Clayton Gordon is on Coumadin, but I am concerned about DVT, based on his  clinical presentation and exam.  We  will perform a ultrasound of his  right lower extremity.   He is finishing up his antibiotic which the Coumadin Clinic is aware of.  His INR is therapeutic.  There are no other major cardiac issues today.  I will see him back if his scan is negative in 3 months.     Thomas C. Daleen Squibb, MD, [facility]  Electronically Signed    TCW/MedQ  DD: 10/28/2008  DT: 10/29/2008  Job #: [id number]   cc:   Hal T. Stoneking, M.D.

## 2011-03-30 ENCOUNTER — Ambulatory Visit
Admission: RE | Admit: 2011-03-30 | Discharge: 2011-03-30 | Disposition: A | Discharge status: Home / Self Care | Payer: Medicare Other | Source: Ambulatory Visit | Attending: Geriatric Medicine | Admitting: Geriatric Medicine

## 2011-03-30 ENCOUNTER — Other Ambulatory Visit: Payer: Self-pay | Admitting: Geriatric Medicine

## 2011-03-30 DIAGNOSIS — R1900 Intra-abdominal and pelvic swelling, mass and lump, unspecified site: Secondary | ICD-10-CM

## 2011-03-30 DIAGNOSIS — R05 Cough: Secondary | ICD-10-CM

## 2011-03-30 MED ORDER — IOHEXOL 300 MG/ML  SOLN
100.0000 mL | Freq: Once | INTRAMUSCULAR | Status: AC | PRN
  Administered 2011-03-30: 100 mL via INTRAVENOUS

## 2011-03-31 NOTE — Assessment & Plan Note (Signed)
[facility]                             PULMONARY OFFICE NOTE   Clayton Gordon, Clayton Gordon                       MRN:          [id number]  DATE:01/16/2007                            DOB:          10-28-1915    CHIEF COMPLAINT:  Evaluate for shortness of breath.   HISTORY OF PRESENT ILLNESS:  An 77 year old white male who has been  labeled as having asthma for many years. He notes progressive wheezing.  He is triggered by moving from hot to cold environments and working in  his yard. Worse going up an incline. He is shortness of breath with  exertion primarily, but not so much at rest. He denies any nocturnal  shortness of breath. He has an occasional dry cough. He had reflux  symptom complexes, but this is controlled on daily Aciphex and b.i.d.  Zantac. The patient denies any sinus complaints. Does note occasional  sneezing. He has never had allergy testing. He uses an over-the-counter  inhaler Primatene Mist only. He smoked a pack a day for 50 years, but  quit smoking in 1983. He is referred for further evaluation.   PAST MEDICAL HISTORY:  1. Angina with heart attack in 1991. Bypass surgery in 1997,  after      stent placements in 1996.  2. History of hypercholesterolemia.  3. History of chronic allergies.   MEDICATION ALLERGIES:  None.   CURRENT MEDICATIONS:  1. Fibercon daily.  2. Lummox 2.5 mg two daily.  3. Ocuvite daily.  4. Nasacort one spray b.i.d. each nostril.  5. Pravachol 20 mg daily at nighttime.  6. Zantac 150 mg b.i.d.  7. Proscar 5 mg daily.  8. Coumadin daily.  9. Detrol 4 mg daily.  10.Calcium daily.  11.Vitamin E daily.  12.Imdur 60 mg daily.  13.Aciphex 20 mg daily.  14.Coreg 25 mg b.i.d.   SOCIAL HISTORY:  Ex-smoker as noted above. He is retired and lives at  home with his wife.   FAMILY HISTORY:  Heart disease in mother, father and two sisters.   REVIEW OF SYSTEMS:  Positive for joint pain. Otherwise,  noncontributory.   PHYSICAL EXAMINATION:  Temperature 97.4, blood pressure 90/52, pulse 63,  saturation is 97% on room air.  CHEST: Showed distant breath sounds with prolonged expiratory phase. Few  expired wheezes with forced exhalation.  CARDIAC: Showed a regular rate and rhythm without S3. Normal S1, S2.  ABDOMEN: Scaphoid. Nontender.  EXTREMITIES: Showed no edema, clubbing or venous disease.  SKIN: Was clear.  NARES: Were clear.  OROPHARYNX: Clear.  NECK: Supple.   LABORATORY DATA:  Spirometry is pending. Chest x-ray is pending.   IMPRESSION:  Is that of likely asthmatic bronchitis with chronic airflow  obstruction at least by physical examination.   PLAN:  The plan for this patient is to begin Advair 250/50 one spray  b.i.d. and the patient was instructed as to its proper use. The patient  will also obtain a chest x-ray and full set of pulmonary function  studies and once the results of these are available further  recommendations will follow.  Charlcie Cradle Delford Field, MD, [facility]  Electronically Signed    PEW/MedQ  DD: 01/16/2007  DT: 01/16/2007  Job #: [id number]   cc:   Thomas C. Wall, MD, FACC  Hal T. Stoneking, M.D.

## 2011-03-31 NOTE — Op Note (Signed)
Clayton Gordon, MACH                ACCOUNT NO.:  [id number]   MEDICAL RECORD NO.:  [id number]          PATIENT TYPE:  AMB   LOCATION:  SDS                          FACILITY:  [facility]   PHYSICIAN:  Velora Heckler, MD      DATE OF BIRTH:  [birth year]   DATE OF PROCEDURE:  05/22/2006  DATE OF DISCHARGE:                                 OPERATIVE REPORT   PREOPERATIVE DIAGNOSIS:  Right inguinal hernia.   POSTOPERATIVE DIAGNOSIS:  Right inguinal hernia.   PROCEDURE:  Repair right inguinal hernia with Ethicon ULTRAPRO mesh.   SURGEON:  Velora Heckler, MD, FACS   ASSISTANT:  Almond Lint, RNFA   ANESTHESIA:  General.   ESTIMATED BLOOD LOSS:  Minimal.   PREPARATION:  Betadine.   COMPLICATIONS:  None.   INDICATIONS:  The patient is an 77 year old white male who presents with  right inguinal hernia.  This was been present for over 50 years.  It has  gradually increased in size and begun causing him mild discomfort.  He has  been wearing a truss for symptomatic relief.  He now comes to surgery for  repair.   DESCRIPTION OF PROCEDURE:  The procedure was done in OR #3 at the [facility].  The patient is brought to the operating room,  placed in a supine position on the operating room table.  Following the  administration of general anesthesia, the patient is prepped and draped in  the usual strict aseptic fashion.  After ascertaining that an adequate level  of anesthesia had been obtained, the right groin is incised with a #10  blade, dissection is carried through the subcutaneous tissues and hemostasis  obtained with the electrocautery.  A Gelpi retractor is placed for exposure.  External oblique fascia is incised in line with its fibers and extended  through the external inguinal ring.  Cord structures are encircled with a  Penrose drain.  The floor of the inguinal canal was gently dissected out.  Cord was explored.  There is a moderate-size indirect inguinal  hernia sac.  This was dissected out up to the level of the internal inguinal ring and a  high ligation of the sac is performed with a 2-0 silk suture ligature.  The  sac is excised and discarded.  Next the floor of the inguinal canal  is  recreated with a sheet of Ethicon ULTRAPRO mesh.  The mesh was cut to the  appropriate dimensions.  It is secured to the pubic tubercle and along the  inguinal ligament with a running 2-0 Novofil suture.  The mesh was split to  accommodate the cord structures.  The superior margin of the mesh was  secured to the transversalis and internal oblique fascia with interrupted 2-  0 Novofil sutures.  The tails of the mesh were overlapped lateral to the  cord structures and secured to the inguinal ligament with interrupted 2-0  Novofil sutures.  Local field block was placed with Marcaine.  Cord  structures were returned to the inguinal canal.  External oblique fascia was  closed  with interrupted 3-0 Vicryl sutures.  Local field block is placed  with Marcaine.  Subcutaneous tissues were closed with interrupted 3-0 Vicryl  sutures.  Skin edges  are anesthetized with local anesthetic.  The skin was closed with a running  4-0 Vicryl subcuticular suture.  The wound is washed and dried and Steri-  Strips are applied.  Sterile dressings are applied.  The patient is awakened  from anesthesia and brought to the recovery room in stable condition.  The  patient tolerated the procedure well.      Velora Heckler, MD  Electronically Signed     TMG/MEDQ  D:  05/22/2006  T:  05/22/2006  Job:  [id number]   cc:   Bertram Millard. Dahlstedt, M.D.  Fax: [phone]   Hal T. Stoneking, M.D.  Fax: [phone]   Jesse Sans. Wall, M.D.  [address]  [address]  [place]  [place] [address]

## 2011-03-31 NOTE — Assessment & Plan Note (Signed)
[facility]                             PULMONARY OFFICE NOTE   AMADEUS, OYAMA                       MRN:          [id number]  DATE:03/05/2007                            DOB:          03/06/1915    Mr. Lohr is a 77 year old male with history of moderate persistent  asthma and associated bronchitis.  Ex-smoker since 1983.  Patient was  started on Advair 250/50 with his last visit in March.  Unfortunately,  he has not taken this on a regular basis, but more on a p.r.n. basis.  He is here today for followup.   His maintenance medicines are listed in the chart, and are correct as  reviewed, and unchanged since the last visit.   EXAMINATION:  Temperature 97.  Blood pressure 114/72.  Pulse 71.  Saturation 99% room air.  CHEST:  Showed diminished breath sounds with a prolonged expiratory  phase.  No wheeze or rhonchi were noted.  CARDIAC EXAM:  Showed a regular rate and rhythm without S3.  Normal S1  and S2.  ABDOMEN:  Soft and non-tender.  EXTREMITIES:  Showed no edema or clubbing.  SKIN:  Was clear.  Pulmonary functions have been obtained today; showed an FEV1 of 84%  predicted, FVC of 91% predicted, FEF 25/75 of 27% predicted.  Total lung  capacity 111% predicted.  Diffusion capacity 104% predicted.   IMPRESSION:  Is that of moderate persistent asthma with associated  asthmatic bronchitis.  Stable at this time.   PLAN:  Maintain Advair on a b.i.d. basis, 1 spray b.i.d.  He was  reeducated as to its proper use, and indicated to this patient he should  use this on a daily basis.  We will see this patient back in return  followup in 4 months.     Charlcie Cradle Delford Field, MD, [facility]  Electronically Signed    PEW/MedQ  DD: 03/05/2007  DT: 03/05/2007  Job #: [id number]   cc:   Thomas C. Wall, MD, FACC  Hal T. Stoneking, M.D.

## 2011-03-31 NOTE — Assessment & Plan Note (Signed)
[facility]                            CARDIOLOGY OFFICE NOTE   MOHAMMEDALI, BEDOY                       MRN:          [id number]  DATE:10/26/2006                            DOB:          February 26, 1915    Clayton Gordon returns today for further management of his coronary artery  disease, paroxysmal atrial fibrillation and flutter status post  pacemaker (he missed his pacer followup visit November 2007),  anticoagulation - do a pro time today, hyperlipidemia and history of  mild orthostatic hypotension and symptoms.   He has done remarkably well. Since I last saw him, he has had his  inguinal hernia repaired.   He is having no symptoms of angina or ischemia. He denies any presyncope  or syncope.   Last Myoview October 10, 2006, EF 60% with a prior inferior wall  infarct with mild peri-infarct ischemia. We have been treating it as a  low risk study, and he has done well.   His medications are:  1. Pravachol 20 mg every night.  2. Zantac 150 b.i.d.  3. Proscar 5 mg daily.  4. Coumadin as directed.  5. Detrol 5 mg a day.  6. Multivitamin daily.  7. Vitamin C daily.  8. Calcium and vitamin D daily.  9. Vitamin E daily.  10.Imdur 60 mg a day.  11.Aciphex 20 mg a day.  12.Coreg 25 mg p.o. b.i.d.  13.Eye drops.  14.Fibercon.   His blood pressure today is 94/60. His pulse is 70, and he is AV paced.  Weight is 150 and stable. He is no acute distress.  SKIN: Warm and dry.  HEENT:  Normocephalic, atraumatic. PERRL. Extraocular movements intact.  Sclerae are clear.  Carotid upstrokes are equal bilaterally with no obvious bruit. There is  no JVD. Thyroid is not enlarged.  LUNGS:  Clear to auscultation.  HEART:  Reveals a regular rate and rhythm, paradoxically split S2.  ABDOMINAL EXAM:  Soft with good bowel sounds. No tenderness.  EXTREMITIES:  Reveal trace edema pretibially.  NEUROLOGICAL:  Intact.   ASSESSMENT AND PLAN:  Clayton Gordon is doing well.  I have made no changes  in his program. We will reschedule his pacer follow up. I will see him  again in 6 months.     Thomas C. Daleen Squibb, MD, [facility]  Electronically Signed    TCW/MedQ  DD: 10/26/2006  DT: 10/26/2006  Job #: [id number]   cc:   Hal T. Stoneking, M.D.

## 2011-04-03 ENCOUNTER — Encounter: Payer: Medicare Other | Admitting: *Deleted

## 2011-04-03 ENCOUNTER — Other Ambulatory Visit: Payer: Medicare Other

## 2011-04-05 ENCOUNTER — Other Ambulatory Visit (HOSPITAL_COMMUNITY): Payer: Self-pay | Admitting: Interventional Radiology

## 2011-04-05 DIAGNOSIS — M549 Dorsalgia, unspecified: Secondary | ICD-10-CM

## 2011-04-05 NOTE — Discharge Summary (Addendum)
  Clayton Gordon, Clayton Gordon                ACCOUNT NO.:  [id number]  MEDICAL RECORD NO.:  [id number]           PATIENT TYPE:  O  LOCATION:  1516                         FACILITY:  [facility]  PHYSICIAN:  Peggye Pitt, M.D. DATE OF BIRTH:  04-Mar-1915  DATE OF ADMISSION:  03/23/2011 DATE OF DISCHARGE:                              DISCHARGE SUMMARY   ADDENDUM  Nothing has changed with this patient overnight since the discharge was dictated yesterday.  The patient was held overnight secondary to bed availability at the facility.  The patient is medically stable and ready for discharge.     Gwenyth Bender, NP   ______________________________ Peggye Pitt, M.D.    KMB/MEDQ  D:  03/27/2011  T:  03/27/2011  Job:  [id number]  Electronically Signed by Toya Smothers  on 04/05/2011 04:49:32 PM Electronically Signed by Peggye Pitt M.D. on 04/06/2011 06:48:21 PM

## 2011-04-06 ENCOUNTER — Encounter: Payer: Self-pay | Admitting: Cardiology

## 2011-04-06 NOTE — Discharge Summary (Signed)
NAMESOCRATES, CAHOON                ACCOUNT NO.:  [id number]  MEDICAL RECORD NO.:  [id number]           PATIENT TYPE:  O  LOCATION:  1516                         FACILITY:  [facility]  PHYSICIAN:  Peggye Pitt, M.D. DATE OF BIRTH:  [birth year]  DATE OF ADMISSION:  03/23/2011 DATE OF DISCHARGE:  03/25/2011                        DISCHARGE SUMMARY - REFERRING   DISCHARGE DIAGNOSES: 1. History of back pain secondary to compression fracture status post     vertebroplasty. 2. History of paroxysmal atrial fibrillation status post ablation and     permanent pacemaker implantation. 3. Ischemic cardiomyopathy with systolic CHF, chronic with ejection     fraction of 40-45%. 4. Coronary artery disease status post coronary artery bypass     grafting. 5. Abdominal aortic aneurysm status post repair, February 2012. 6. Remote history of tobacco abuse. 7. Hypertension. 8. Hyperlipidemia. 9. Asthma. 10.Gastroesophageal reflux disease.  DISCHARGE MEDICATIONS: 1. Oxycodone 5 mg every 4 hours as needed for back pain. 2. Aciphex 20 mg at bedtime. 3. Advair 250/50 one puff twice daily. 4. Aspirin 81 mg daily. 5. Calcium 600 mg 1 tablet in the morning. 6. Coreg 25 mg twice daily. 7. Coumadin 5 mg tablets, she takes half a tablet on Monday,     Wednesday, Saturday and whole tablet all other days. 8. Cholestyramine 4 g in the morning. 9. Finasteride 5 mg at bedtime. 10.Lasix 40 mg daily. 11.Imdur 90 mg daily. 12.Macular Health vitamin over-the-counter 1 tablet at bedtime. 13.Mucinex 600 mg every 12 hours. 14.Multivitamin 1 tablet daily. 15.Nitroglycerin 0.4 mg sublingual every 5 minutes up to 3 times as     needed for chest pain. 16.Pravastatin 20 mg at bedtime. 17.Vitamin C 500 mg daily. 18.Vitamin D 1000 mg daily. 19.Zantac 150 mg twice daily.  DISPOSITION AND FOLLOWUP:  Mr. Forget will be discharged to skilled nursing facility hopefully today in stable and improved condition.  His pain  has been well controlled with oxycodone.  IMAGES AND PROCEDURES:  Include: 1. Lumbar spine x-ray on Mar 23, 2011, that showed progressive     compression of L2 vertebra despite interval vertebroplasty     procedure.  Diffuse demineralization.  No new compression fractures     are identified. 2. A CT scan of the head on Mar 24, 2011, that showed extensive brain     atrophy and microvascular ischemic changes. 3. A CT scan of the lumbar spine on Mar 24, 2011, that showed     compression fracture of L2 with interval progression post     kyphoplasty.  Stable compression of the superior endplate of T12.     Degenerative changes.  HISTORY AND PHYSICAL:  For full details, please see dictation by Dr. Selena Batten on Mar 24, 2011.  However in brief Mr. Corkum is a very pleasant 77 year old gentleman who recently suffered an L2 compression fracture and is now status post vertebroplasty.  He was apparently in his usual state of health until the day of admission, he was walking with his [place] and suddenly his legs felt very weak and buckled.  He denied any focal neurologic deficits..  Because of this, he was  referred to [place] for admission for evaluation and management.  HOSPITAL COURSE BY PROBLEM:  Back pain and leg weakness secondary to progression of L2 compression fracture.  His pain has been extremely well controlled on oxycodone 5 mg.  He has been evaluated by both Physical and Occupational Therapy who believe that he was appropriate for short-term SNF at this time for rehabilitation purposes.  His goal is to eventually return to independent living with his wife.  Rest of chronic conditions have been stable.  His home medications have not been altered.  Vitals on day of discharge, blood pressure 159/71, heart rate 64, respirations 18, sats 97% on room air, and temperature of 98.0.     Peggye Pitt, M.D.     EH/MEDQ  D:  03/25/2011  T:  03/25/2011  Job:  [id number]  Electronically Signed  by Peggye Pitt M.D. on 04/06/2011 06:48:11 PM

## 2011-04-12 ENCOUNTER — Ambulatory Visit (HOSPITAL_COMMUNITY)
Admission: RE | Admit: 2011-04-12 | Discharge: 2011-04-12 | Disposition: A | Discharge status: Home / Self Care | Payer: Medicare Other | Source: Ambulatory Visit | Attending: Interventional Radiology | Admitting: Interventional Radiology

## 2011-04-12 DIAGNOSIS — M549 Dorsalgia, unspecified: Secondary | ICD-10-CM

## 2011-04-13 ENCOUNTER — Ambulatory Visit (INDEPENDENT_AMBULATORY_CARE_PROVIDER_SITE_OTHER): Payer: Medicare Other | Admitting: Cardiology

## 2011-04-13 ENCOUNTER — Encounter: Payer: Self-pay | Admitting: Cardiology

## 2011-04-13 VITALS — BP 106/52 | HR 70 | Ht 65.0 in | Wt 130.4 lb

## 2011-04-13 DIAGNOSIS — I714 Abdominal aortic aneurysm, without rupture: Secondary | ICD-10-CM

## 2011-04-13 DIAGNOSIS — I2510 Atherosclerotic heart disease of native coronary artery without angina pectoris: Secondary | ICD-10-CM

## 2011-04-13 DIAGNOSIS — I4891 Unspecified atrial fibrillation: Secondary | ICD-10-CM

## 2011-04-13 NOTE — Progress Notes (Signed)
HPI Clayton Gordon returns today for evaluation and management of his coronary disease, chronic atrial fibrillation, anticoagulation, and history of abdominal aortic aneurysm repair with stenting.  He recent suffered a fall and a lumbar fracture. He said vertebral plasty and is now in a rehabilitation center. He developed bronchitis and just finished a course of antibiotics with Dr. Pete Glatter. He is quite congested and still coughing up some green sputum. He  He denies any angina or chest discomfort. He has not had syncope. When he fell he hit his head. CT Scan of the head did not show any bleed but extensive atrophy and microvascular ischemic changes.  He's walking with a [place] now. He's having to go home next week. He has a wife and daughter who are very supportive and both are here today.  Past Medical History  Diagnosis Date  . CAD (coronary artery disease)     s/p CABG  . Asthma   . PAF (paroxysmal atrial fibrillation)   . Bradycardia   . HTN (hypertension)   . HLD (hyperlipidemia)   . AAA (abdominal aortic aneurysm)   . Orthostatic hypotension   . Dysphagia     Past Surgical History  Procedure Date  . Coronary artery bypass graft 1996  . Pacemaker insertion 2/02    Medtronic Sigma  . Inguinal hernia repair     right    No family history on file.  History   Social History  . Marital Status: Married    Spouse Name: N/A    Number of Children: N/A  . Years of Education: N/A   Occupational History  . Not on file.   Social History Main Topics  . Smoking status: Former Smoker    Quit date: 11/13/1990  . Smokeless tobacco: Not on file  . Alcohol Use: Yes     social  . Drug Use: No  . Sexually Active: Not on file   Other Topics Concern  . Not on file   Social History Narrative  . No narrative on file    No Known Allergies  Current Outpatient Prescriptions  Medication Sig Dispense Refill  . aspirin 81 MG tablet Take 81 mg by mouth daily.        .  calcium-vitamin D (OSCAL) 250-125 MG-UNIT per tablet Take 1 tablet by mouth daily.        . carvedilol (COREG) 25 MG tablet Take 1 tablet by mouth Twice daily.      . cholecalciferol (VITAMIN D) 1000 UNITS tablet Take 1,000 Units by mouth daily.        . cholestyramine (QUESTRAN) 4 G packet Take 1 packet by mouth daily after breakfast.        . finasteride (PROSCAR) 5 MG tablet Take 1 tablet by mouth daily.      . Fluticasone-Salmeterol (ADVAIR DISKUS) 250-50 MCG/DOSE AEPB Inhale 1 puff into the lungs every 12 (twelve) hours.        . furosemide (LASIX) 40 MG tablet Take 1 tablet by mouth daily.      Marland Kitchen guaiFENesin (MUCINEX) 600 MG 12 hr tablet Take 1,200 mg by mouth 2 (two) times daily.        . isosorbide mononitrate (IMDUR) 60 MG 24 hr tablet Take 1.5 tablets by mouth daily.      . Multiple Vitamin (MULTIVITAMIN) tablet Take 1 tablet by mouth daily.        Marland Kitchen NITROSTAT 0.4 MG SL tablet Take 1 tablet by mouth Every 5 minutes as needed.      Marland Kitchen  OxyCODONE HCl, Abuse Deter, 5 MG TABS Take 1 tablet by mouth every 4 (four) hours as needed.        . pravastatin (PRAVACHOL) 20 MG tablet Take 1 tablet by mouth daily.      . RABEprazole (ACIPHEX) 20 MG tablet Take 20 mg by mouth daily.        . ranitidine (ZANTAC) 150 MG tablet Take 150 mg by mouth 2 (two) times daily.        . vitamin C (ASCORBIC ACID) 500 MG tablet Take 500 mg by mouth daily.        Marland Kitchen warfarin (COUMADIN) 5 MG tablet Take by mouth as directed.        Marland Kitchen dextromethorphan-guaiFENesin (MUCINEX DM) 30-600 MG per 12 hr tablet Take 1 tablet by mouth every 12 (twelve) hours as needed.          ROS Negative other than HPI.   PE General Appearance: well developed, well nourished elderly male in no acute distress HEENT: symmetrical face, PERRLA, good dentition  Neck: no JVD, thyromegaly, or adenopathy, trachea midline Chest: symmetric without deformity Cardiac: PMI non-displaced, irregular rate and rhythm normal S1, S2, no gallop or  murmur Lung: inspiratory and expiratory rhonchi with frequent coughing Vascular: all pulses full without bruits  Abdominal: nondistended, nontender, good bowel sounds, no HSM, no bruits Extremities: no cyanosis, clubbing or edema, no sign of DVT, no varicosities  Skin: normal color, no rashes Neuro: alert and oriented x 3, non-focal Pysch: normal affect Filed Vitals:   04/13/11 1122  BP: 106/52  Pulse: 70  Height: 5\' 5"  (1.651 m)  Weight: 130 lb 6.4 oz (59.149 kg)    EKG  Labs and Studies Reviewed.   Lab Results  Component Value Date   WBC 7.7 03/24/2011   HGB 11.3* 03/24/2011   HCT 34.9* 03/24/2011   MCV 97.2 03/24/2011   PLT 158 03/24/2011      Chemistry      Component Value Date/Time   NA 138 03/24/2011 0537   K 4.0 DELTA CHECK NOTED REPEATED TO VERIFY 03/24/2011 0537   CL 103 03/24/2011 0537   CO2 29 03/24/2011 0537   BUN 17 03/24/2011 0537   CREATININE 0.85 03/24/2011 0537      Component Value Date/Time   CALCIUM 9.1 03/24/2011 0537   ALKPHOS 100 03/24/2011 0537   AST 17 03/24/2011 0537   ALT 10 03/24/2011 0537   BILITOT 0.5 03/24/2011 0537       No results found for this basename: CHOL   No results found for this basename: HDL   No results found for this basename: LDLCALC   No results found for this basename: TRIG   No results found for this basename: CHOLHDL   No results found for this basename: HGBA1C   Lab Results  Component Value Date   ALT 10 03/24/2011   AST 17 03/24/2011   ALKPHOS 100 03/24/2011   BILITOT 0.5 03/24/2011   Lab Results  Component Value Date   TSH 2.749 01/08/2011

## 2011-04-13 NOTE — Assessment & Plan Note (Signed)
Stable by CT scan on Mar 30, 2011.

## 2011-04-13 NOTE — Assessment & Plan Note (Signed)
Stable. Continue medical therapy 

## 2011-04-13 NOTE — Assessment & Plan Note (Signed)
Stable with good rate control and anticoagulation. 

## 2011-04-13 NOTE — Patient Instructions (Signed)
Your physician recommends that you schedule a follow-up appointment in: 6 months with Dr. Wall  

## 2011-04-18 ENCOUNTER — Telehealth: Payer: Self-pay | Admitting: Internal Medicine

## 2011-04-18 NOTE — Telephone Encounter (Signed)
Patient needs appointment with [facility].  Caregiver to call and schedule

## 2011-04-18 NOTE — Telephone Encounter (Signed)
Call pt at home cause pacer was checked while he was in rehab but they wanted to know how often should they be checked

## 2011-04-23 NOTE — H&P (Signed)
Clayton Gordon, Clayton Gordon                ACCOUNT NO.:  [id number]  MEDICAL RECORD NO.:  [id number]           PATIENT TYPE:  I  LOCATION:  1516                         FACILITY:  [facility]  PHYSICIAN:  Massie Maroon, MD        DATE OF BIRTH:  1915-02-06  DATE OF ADMISSION:  03/23/2011 DATE OF DISCHARGE:                             HISTORY & PHYSICAL   CHIEF COMPLAINT:  Weakness in legs.  HISTORY OF PRESENT ILLNESS:  A 77 year old male with a history of L2 compression fracture, apparently complains of weakness in the legs.  The patient is not very specific about when this started.  However, today while ambulating in his [place], the patient became weak in his legs. The patient denies any slurred speech, focal weakness, numbness, tingling, seizure activity, incontinence.  The patient denies any pain radiating down his legs, fever, chills, significant weight loss.  The patient will be admitted for evaluation of weakness.  PAST MEDICAL HISTORY:1. History of back pain secondary to acute compression fracture status     post vertebroplasty. 2. L2 pedicle fracture bilaterally. 3. AFIB (paroxysmal) status post ablation with history of bradycardia,     status post Medtronic dual-chamber pacemaker placement. 4. Recent cardiac cath in February showed EF 40-45% with 3-vessel     disease. 5. CAD status post CABG. 6. Recent AAA repair, percutaneous endovascular on December 21, 2010     with 6.6 cm diameter. 7. Remote history of tobacco use, quit in 1983. 8. History of hypertension and hyperlipidemia. 9. Asthma. 10.Aspiration pneumonia. 11.GERD. 12.Hernia repair, right inguinal 13.History of foot surgery.  ALLERGIES:  No known drug allergies.  MEDICATIONS: 1. Cholestyramine 4 grams p.o. b.i.d. 2. Vicodin 5/225 one p.o. q.4 h. p.r.n. 3. AcipHex 20 mg p.o. q.h.s. 4. Advair Diskus 250/50 one puff b.i.d. 5. Enteric-coated aspirin 81 mg p.o. daily. 6. Calcium plus D 1 p.o. q.a.m. 7. Carvedilol 25 mg  p.o. b.i.d. 8. Coumadin 5 mg Sunday, Tuesday, Thursday, Friday and 2.5 mg Monday,     Wednesday, Saturday. 9. Finasteride 5 mg p.o. q.h.s. 10.Furosemide 40 mg p.o. daily. 11.Imdur 60 mg 1-1/2 p.o. q.a.m. 12.Macular Health vitamin 1 p.o. q.h.s. 13.Mucinex 600 mg 1 p.o. q.12 h. P.r.n. 14.Multivitamin 1 p.o. q.a.m. 15.Sublingual nitroglycerin 0.4 mg p.o. q. 5 minutes p.r.n. 16.Pravastatin 20 mg p.o. q.h.s. 17.Vitamin C 1 p.o. q.a.m. 18.Vitamin C 500 mg p.o. q.a.m. 19.Vitamin D 1 p.o. q.a.m. 20.Zantac 150 mg p.o. b.i.d.  REVIEW OF SYSTEMS:  Negative for all 10 organ systems except for pertinent positives stated above.  PHYSICAL EXAMINATION:  VITAL SIGNS:  Temperature 98.1, pulse 61, blood pressure 105/49, pulse ox 98% on room air. HEENT:  Anicteric. NECK:  No JVD. HEART:  Regular rate and rhythm.  S1, S2. LUNGS:  Clear to auscultation bilaterally. ABDOMEN:  Soft, nontender, nondistended.  Positive bowel sounds. EXTREMITIES:  No cyanosis, clubbing or edema. SKIN:  No rashes. LYMPH NODES:  No adenopathy. NEUROLOGIC:  Nonfocal.  The patient does appear to have -5/5 strength in the proximal lower extremities bilaterally.  LABORATORY DATA:  Urinalysis negative.  WBC 6.8, hemoglobin 11.4, platelet count  185.  Sodium 134, potassium 5.2, BUN 21, creatinine 0.87, INR 2.13.  IMPRESSION AND PLAN: 1. Weakness.  Check CT brain, rule out CVA, and check CT of the of     lumbar spine.  Consider PT, OT in the a.m. 2. Paroxysmal atrial fibrillation.  Continue Coumadin, carvedilol. 3. Coronary artery disease status post coronary artery bypass graft.     Continue aspirin, carvedilol, pravastatin. 4. Blindness/macular degeneration, stable. 5. Asthma.  Continue Advair Diskus. 6. History of L2 compression fracture.  Continue present pain     medications. 7. Deep venous thrombosis prophylaxis, Coumadin.     Massie Maroon, MD     JYK/MEDQ  D:  03/24/2011  T:  03/24/2011  Job:  [id number]  cc:    Hal T. Stoneking, M.D. Fax: [phone]  Jesse Sans. Wall, MD, FACC [address]  [address] [place] [address]  Electronically Signed by Pearson Grippe MD on 04/23/2011 07:40:59 PM

## 2011-05-08 ENCOUNTER — Ambulatory Visit: Payer: Medicare Other | Admitting: Surgery

## 2011-05-08 ENCOUNTER — Other Ambulatory Visit: Payer: Medicare Other

## 2011-05-09 ENCOUNTER — Ambulatory Visit (INDEPENDENT_AMBULATORY_CARE_PROVIDER_SITE_OTHER): Payer: Medicare Other | Admitting: *Deleted

## 2011-05-09 DIAGNOSIS — I4891 Unspecified atrial fibrillation: Secondary | ICD-10-CM

## 2011-05-13 ENCOUNTER — Encounter: Payer: Self-pay | Admitting: Internal Medicine

## 2011-05-13 DIAGNOSIS — I459 Conduction disorder, unspecified: Secondary | ICD-10-CM

## 2011-05-16 ENCOUNTER — Ambulatory Visit (INDEPENDENT_AMBULATORY_CARE_PROVIDER_SITE_OTHER): Payer: Medicare Other | Admitting: *Deleted

## 2011-05-16 DIAGNOSIS — I4891 Unspecified atrial fibrillation: Secondary | ICD-10-CM

## 2011-05-16 LAB — POCT INR: INR: 1.9

## 2011-05-30 ENCOUNTER — Encounter: Payer: Medicare Other | Admitting: *Deleted

## 2011-05-31 ENCOUNTER — Encounter: Payer: Medicare Other | Admitting: *Deleted

## 2011-05-31 ENCOUNTER — Telehealth: Payer: Self-pay | Admitting: Pharmacist

## 2011-05-31 NOTE — Telephone Encounter (Signed)
Britta Mccreedy called to report pt is scheduled to have colonoscopy on 7/24.  Will need to get clearance for pt to stop Coumadin.  Reviewed past notes.  Dr. Daleen Squibb stated okay to hold Coumadin with no Lovenox

## 2011-05-31 NOTE — Telephone Encounter (Signed)
Edit to previous note:  Dr. Daleen Squibb stated okay to hold Coumadin with no Lovenox in January.  Per note in April, Dr. Daleen Squibb stated okay to hold but Lovenox overlap would be best.  Dr. Daleen Squibb out of town so unable to clarify.  Pt has no history of TIA or CVA.  Dr. Ladona Ridgel manages pt's device and available for consult.  Given pt's age and risk factors, he agreed okay to hold Coumadin with no Lovenox bridge.  Barbara at Dr. Marlane Hatcher office aware.  Spoke with pt's wife.  She is aware pt will take last dose of Coumadin on 7/19, colonoscopy on 7/24, and restart at his normal dose of Coumadin after procedure.  Will f/u with INR check on 8/1.

## 2011-06-05 ENCOUNTER — Encounter (HOSPITAL_COMMUNITY)
Admission: RE | Admit: 2011-06-05 | Discharge: 2011-06-05 | Disposition: A | Discharge status: Home / Self Care | Payer: Medicare Other | Source: Ambulatory Visit | Attending: Gastroenterology | Admitting: Gastroenterology

## 2011-06-06 ENCOUNTER — Other Ambulatory Visit: Payer: Self-pay | Admitting: Gastroenterology

## 2011-06-06 ENCOUNTER — Ambulatory Visit (HOSPITAL_COMMUNITY)
Admission: RE | Admit: 2011-06-06 | Discharge: 2011-06-06 | Disposition: A | Discharge status: Home / Self Care | Payer: Medicare Other | Source: Ambulatory Visit | Attending: Gastroenterology | Admitting: Gastroenterology

## 2011-06-06 DIAGNOSIS — D371 Neoplasm of uncertain behavior of stomach: Secondary | ICD-10-CM | POA: Insufficient documentation

## 2011-06-06 DIAGNOSIS — I252 Old myocardial infarction: Secondary | ICD-10-CM | POA: Insufficient documentation

## 2011-06-06 DIAGNOSIS — K648 Other hemorrhoids: Secondary | ICD-10-CM | POA: Insufficient documentation

## 2011-06-06 DIAGNOSIS — K219 Gastro-esophageal reflux disease without esophagitis: Secondary | ICD-10-CM | POA: Insufficient documentation

## 2011-06-06 DIAGNOSIS — M545 Low back pain, unspecified: Secondary | ICD-10-CM | POA: Insufficient documentation

## 2011-06-06 DIAGNOSIS — G8929 Other chronic pain: Secondary | ICD-10-CM | POA: Insufficient documentation

## 2011-06-06 DIAGNOSIS — Z951 Presence of aortocoronary bypass graft: Secondary | ICD-10-CM | POA: Insufficient documentation

## 2011-06-06 DIAGNOSIS — I739 Peripheral vascular disease, unspecified: Secondary | ICD-10-CM | POA: Insufficient documentation

## 2011-06-06 DIAGNOSIS — J45909 Unspecified asthma, uncomplicated: Secondary | ICD-10-CM | POA: Insufficient documentation

## 2011-06-06 DIAGNOSIS — M25559 Pain in unspecified hip: Secondary | ICD-10-CM | POA: Insufficient documentation

## 2011-06-06 DIAGNOSIS — H543 Unqualified visual loss, both eyes: Secondary | ICD-10-CM | POA: Insufficient documentation

## 2011-06-06 DIAGNOSIS — K644 Residual hemorrhoidal skin tags: Secondary | ICD-10-CM | POA: Insufficient documentation

## 2011-06-06 DIAGNOSIS — Z7901 Long term (current) use of anticoagulants: Secondary | ICD-10-CM | POA: Insufficient documentation

## 2011-06-06 DIAGNOSIS — I2510 Atherosclerotic heart disease of native coronary artery without angina pectoris: Secondary | ICD-10-CM | POA: Insufficient documentation

## 2011-06-06 DIAGNOSIS — I4891 Unspecified atrial fibrillation: Secondary | ICD-10-CM | POA: Insufficient documentation

## 2011-06-06 DIAGNOSIS — Z950 Presence of cardiac pacemaker: Secondary | ICD-10-CM | POA: Insufficient documentation

## 2011-06-06 DIAGNOSIS — Z01812 Encounter for preprocedural laboratory examination: Secondary | ICD-10-CM | POA: Insufficient documentation

## 2011-06-06 DIAGNOSIS — R197 Diarrhea, unspecified: Secondary | ICD-10-CM | POA: Insufficient documentation

## 2011-06-06 LAB — PROTIME-INR: Prothrombin Time: 28.8 seconds — ABNORMAL HIGH (ref 11.6–15.2)

## 2011-06-06 LAB — ABO/RH: ABO/RH(D): A POS

## 2011-06-07 LAB — PREPARE FRESH FROZEN PLASMA: Unit division: 0

## 2011-06-14 ENCOUNTER — Ambulatory Visit (INDEPENDENT_AMBULATORY_CARE_PROVIDER_SITE_OTHER): Payer: Medicare Other | Admitting: *Deleted

## 2011-06-14 DIAGNOSIS — I4891 Unspecified atrial fibrillation: Secondary | ICD-10-CM

## 2011-06-20 NOTE — Op Note (Signed)
  NAMERONDY, Clayton Gordon                ACCOUNT NO.:  [id number]  MEDICAL RECORD NO.:  [id number]  LOCATION:                                 FACILITY:  PHYSICIAN:  Petra Kuba, M.D.         DATE OF BIRTH:  DATE OF PROCEDURE:  06/06/2011 DATE OF DISCHARGE:                              OPERATIVE REPORT   PROCEDURE:  Flexible sigmoidoscopy with polypectomy.  INDICATIONS:  The patient with a large anorectal polyp, diarrhea, high- grade dysplasia, want to retry polypectomy.  Consent was signed after risks, benefits, methods, options thoroughly discussed multiple times in the past with both the patient and multiple family members.  MEDICINES USED:  Per Anesthesia, ketamine 10 mg, fentanyl 100 mcg, propofol 30 mg.  PROCEDURE:  Rectal inspection is pertinent for small external hemorrhoids.  Digital exam is pertinent for being able to feel the mass, although not obstructive.  The video pediatric colonoscope was inserted, easily advanced to 25 cm.  No lesions were seen in the sigmoid.  The prep was adequate.  The scope was slowly withdrawn.  Anorectal polyp was large, encompassed about third to half of the wall all the way up to the anorectal junction and was probably 3 cm to 4 cm with multiple indentations and peaks.  Photo documentation was obtained.  Using multiple different snares on both straight and retroflex visualization, at least 30 different polypectomies were done.  All pieces were either suctioned on to the endoscope or suctioned through the scope and collected in the trap.  We proceeded with the actual part of this procedure for 1 hour and 15 minutes taking multiple pieces and multiple piecemeal whittling down.  There was no active bleeding or other complications.  However, after a prolonged effort, we elected to stop the procedure.  There was definitely residual polyp, but it probably removed 75-80% in our estimation.  We elected not to do any lasering since there was no  active bleeding.  Air was suctioned.  Scope removed. The patient tolerated the procedure well.  There was no obvious immediate complication.  ENDOSCOPIC DIAGNOSES: 1. Internal and external hemorrhoids. 2. Large anorectal polyp status post multiple snares using multiple     different shapes and sizes of snares. 3. Otherwise within normal limits to 25 cm.  PLAN:  Await pathology.  If no delayed complications, resume Coumadin tomorrow at 2.5 for a week and if doing well can go back to his normal dose next week.  Will hold aspirin for 3 days.  Call me p.r.n. Otherwise, wait on pathology to decide further workup and plans.          ______________________________ Petra Kuba, M.D.     MEM/MEDQ  D:  06/06/2011  T:  06/06/2011  Job:  [id number]  cc:   Petra Kuba, M.D. Fax: [phone]  Hal T. Stoneking, M.D. Fax: [phone]  Electronically Signed by Vida Rigger M.D. on 06/20/2011 04:34:34 PM

## 2011-06-22 ENCOUNTER — Ambulatory Visit (INDEPENDENT_AMBULATORY_CARE_PROVIDER_SITE_OTHER): Payer: Medicare Other | Admitting: *Deleted

## 2011-06-22 DIAGNOSIS — I4891 Unspecified atrial fibrillation: Secondary | ICD-10-CM

## 2011-06-30 ENCOUNTER — Encounter: Payer: Self-pay | Admitting: Surgery

## 2011-07-11 ENCOUNTER — Telehealth: Payer: Self-pay | Admitting: Cardiology

## 2011-07-11 MED ORDER — PRAVASTATIN SODIUM 20 MG PO TABS: 20.0000 mg | ORAL_TABLET | Freq: Every day | ORAL | Status: DC

## 2011-07-11 MED ORDER — ISOSORBIDE MONONITRATE ER 60 MG PO TB24: 90.0000 mg | ORAL_TABLET | Freq: Every day | ORAL | Status: DC

## 2011-07-11 MED ORDER — ISOSORBIDE MONONITRATE ER 60 MG PO TB24: ORAL_TABLET | ORAL | Status: DC

## 2011-07-11 NOTE — Telephone Encounter (Signed)
Pt needs all his meds for his heart to be refill and sent [employer] mail in order. Pt also wants to talk to a nurse re his meds.

## 2011-07-11 NOTE — Telephone Encounter (Signed)
Pt would like his pravastatin and Imdur sent to [employer] order for refill. Order faxed [name] RN

## 2011-07-18 ENCOUNTER — Ambulatory Visit: Payer: Medicare Other | Admitting: Cardiology

## 2011-07-20 ENCOUNTER — Ambulatory Visit (INDEPENDENT_AMBULATORY_CARE_PROVIDER_SITE_OTHER): Payer: Medicare Other | Admitting: *Deleted

## 2011-07-20 DIAGNOSIS — I4891 Unspecified atrial fibrillation: Secondary | ICD-10-CM

## 2011-07-20 LAB — POCT INR: INR: 2.1

## 2011-07-21 ENCOUNTER — Encounter: Payer: Self-pay | Admitting: Surgery

## 2011-07-24 ENCOUNTER — Other Ambulatory Visit: Payer: Self-pay | Admitting: Surgery

## 2011-07-24 ENCOUNTER — Ambulatory Visit
Admission: RE | Admit: 2011-07-24 | Discharge: 2011-07-24 | Disposition: A | Discharge status: Home / Self Care | Payer: Medicare Other | Source: Ambulatory Visit | Attending: Surgery | Admitting: Surgery

## 2011-07-24 ENCOUNTER — Ambulatory Visit (INDEPENDENT_AMBULATORY_CARE_PROVIDER_SITE_OTHER): Payer: Medicare Other | Admitting: Surgery

## 2011-07-24 ENCOUNTER — Encounter: Payer: Self-pay | Admitting: Surgery

## 2011-07-24 ENCOUNTER — Inpatient Hospital Stay: Admission: RE | Admit: 2011-07-24 | Payer: Medicare Other | Source: Ambulatory Visit

## 2011-07-24 VITALS — BP 134/69 | HR 67 | Resp 18 | Wt 138.0 lb

## 2011-07-24 DIAGNOSIS — Z8679 Personal history of other diseases of the circulatory system: Secondary | ICD-10-CM

## 2011-07-24 DIAGNOSIS — Z9889 Other specified postprocedural states: Secondary | ICD-10-CM

## 2011-07-24 DIAGNOSIS — I714 Abdominal aortic aneurysm, without rupture: Secondary | ICD-10-CM

## 2011-07-24 LAB — BUN: BUN: 28 mg/dL — ABNORMAL HIGH (ref 6–23)

## 2011-07-24 MED ORDER — IOHEXOL 350 MG/ML SOLN
100.0000 mL | Freq: Once | INTRAVENOUS | Status: AC | PRN
  Administered 2011-07-24: 100 mL via INTRAVENOUS

## 2011-07-24 NOTE — Progress Notes (Signed)
[facility] [place]   Patient name: Clayton Gordon MRN: [id number] DOB: 09-27-1915 Sex: male   Referred by: Osker Mason  Reason for referral: AAA Chief Complaint  Patient presents with  . AAA    3 mo followup/ stent/ Ct today    HISTORY OF PRESENT ILLNESS: The patient is a 77 year old gentleman who returns today for followup of his endovascular aneurysm which was repaired percutaneously on 12/21/2010. At that time he had a symptomatic 6.5 cm infrarenal abdominal aortic aneurysm. His pain resolved upon stent graft placement. At his initial followup scan he had a type II endoleak. He is back today for followup and repeat CT scan. Overall, he is doing very well he does not have any abdominal pain.  Past Medical History  Diagnosis Date  . CAD (coronary artery disease)     s/p CABG  . Asthma   . PAF (paroxysmal atrial fibrillation)   . Bradycardia   . HTN (hypertension)   . HLD (hyperlipidemia)   . AAA (abdominal aortic aneurysm)   . Orthostatic hypotension   . Dysphagia   . Myocardial infarction 1989  . Gastroesophageal reflux disease   . CHF (congestive heart failure)   . Macular degeneration     Past Surgical History  Procedure Date  . Coronary artery bypass graft 1996  . Pacemaker insertion 2/02    Med[employer]onic Sigma  . Inguinal hernia repair     right    History   Social History  . Marital Status: Married    Spouse Name: N/A    Number of Children: N/A  . Years of Education: N/A   Occupational History  . Not on file.   Social History Main Topics  . Smoking status: Former Smoker    Types: Cigarettes    Quit date: 11/13/1988  . Smokeless tobacco: Not on file  . Alcohol Use: Yes     social  . Drug Use: No  . Sexually Active: Not on file   Other Topics Concern  . Not on file   Social History Narrative  . No narrative on file    Family History  Problem Relation Age of Onset  . Heart disease Mother   . Heart disease Father   . Heart  disease Sister   . Diabetes Daughter   . Stroke Daughter   . Stroke Son     Allergies as of 07/24/2011  . (No Known Allergies)    Current Outpatient Prescriptions on File Prior to Visit  Medication Sig Dispense Refill  . calcium-vitamin D (OSCAL) 250-125 MG-UNIT per tablet Take 1 tablet by mouth daily.        . carvedilol (COREG) 25 MG tablet Take 1 tablet by mouth Twice daily.      . cholecalciferol (VITAMIN D) 1000 UNITS tablet Take 1,000 Units by mouth daily.        Marland Kitchen dextromethorphan-guaiFENesin (MUCINEX DM) 30-600 MG per 12 hr tablet Take 1 tablet by mouth every 12 (twelve) hours as needed.        . finasteride (PROSCAR) 5 MG tablet Take 1 tablet by mouth daily.      . Fluticasone-Salmeterol (ADVAIR DISKUS) 250-50 MCG/DOSE AEPB Inhale 1 puff into the lungs every 12 (twelve) hours.        . furosemide (LASIX) 40 MG tablet Take 1 tablet by mouth daily.      Marland Kitchen guaiFENesin (MUCINEX) 600 MG 12 hr tablet Take 1,200 mg by mouth 2 (two) times daily.        Marland Kitchen  isosorbide mononitrate (IMDUR) 60 MG 24 hr tablet Take 1 and 1/2 tablets daily  180 tablet  3  . Multiple Vitamin (MULTIVITAMIN) tablet Take 1 tablet by mouth daily.        Marland Kitchen NITROSTAT 0.4 MG SL tablet Take 1 tablet by mouth Every 5 minutes as needed.      . pravastatin (PRAVACHOL) 20 MG tablet Take 1 tablet (20 mg total) by mouth daily.  90 tablet  3  . Probiotic Product (RESTORA PO) Take by mouth daily.        . RABEprazole (ACIPHEX) 20 MG tablet Take 20 mg by mouth daily.        . ranitidine (ZANTAC) 150 MG tablet Take 150 mg by mouth 2 (two) times daily.        . vitamin C (ASCORBIC ACID) 500 MG tablet Take 500 mg by mouth daily.        Marland Kitchen warfarin (COUMADIN) 5 MG tablet Take by mouth as directed.       Marland Kitchen aspirin 81 MG tablet Take 81 mg by mouth daily.        . cholestyramine (QUESTRAN) 4 G packet Take 1 packet by mouth daily after breakfast.        . OxyCODONE HCl, Abuse Deter, 5 MG TABS Take 1 tablet by mouth every 4 (four) hours  as needed.         No current facility-administered medications on file prior to visit.     REVIEW OF SYSTEMS: Cardiovascular: No chest pain, chest pressure, palpitations, orthopnea, or dyspnea on exertion. No claudication or rest pain,  No history of DVT or phlebitis. Pulmonary: No productive cough, asthma or wheezing. Neurologic: No weakness, paresthesias, aphasia, or amaurosis. No dizziness. Hematologic: No bleeding problems or clotting disorders. Musculoskeletal: No joint pain or joint swelling. Gastrointestinal: No blood in stool or hematemesis Genitourinary: No dysuria or hematuria. Psychiatric:: No history of major depression. Integumentary: No rashes or ulcers. Constitutional: No fever or chills.  PHYSICAL EXAMINATION: General: The patient appears their stated age.  Vital signs are BP 134/69  Pulse 67  Resp 18  Wt 138 lb (62.596 kg) Pulmonary: There is a good air exchange bilaterally  Abdomen: Soft and non-tender with normal pitch bowel sounds. Musculoskeletal: There are no major deformities.  There is no significant extremity pain. Neurologic: No focal weakness or paresthesias are detected, Psychiatric: The patient has normal affect. Cardiovascular: There is a regular rate and rhythm without significant murmur appreciated.   Outside Studies/Documentation CT angiogram today shows persistent type II endoleak with slight enlargement of his native aneurysm sac.  Medical Decision Making  Clayton Gordon is a 77 y.o. male who presents with:  Chief Complaint  Patient presents with  . AAA    3 mo followup/ stent/ Ct today   Enlarging abdominal aortic aneurysm following stent graft placement with known type II endoleak. I again discussed the significance of endoleak. Since his aneurysm has increased in size I am going to refer him to Dr. Fredia Sorrow to discuss possible embolization.   [facility] [place] Office:  [phone]

## 2011-07-25 NOTE — Progress Notes (Signed)
Addended by: Sharee Pimple on: 07/25/2011 09:26 AM   Modules accepted: Orders

## 2011-07-26 ENCOUNTER — Other Ambulatory Visit: Payer: Self-pay | Admitting: Surgery

## 2011-07-26 ENCOUNTER — Other Ambulatory Visit: Payer: Self-pay | Admitting: *Deleted

## 2011-07-26 DIAGNOSIS — I714 Abdominal aortic aneurysm, without rupture: Secondary | ICD-10-CM

## 2011-08-01 ENCOUNTER — Ambulatory Visit
Admission: RE | Admit: 2011-08-01 | Discharge: 2011-08-01 | Disposition: A | Discharge status: Home / Self Care | Payer: Medicare Other | Source: Ambulatory Visit | Attending: Surgery | Admitting: Surgery

## 2011-08-01 VITALS — BP 106/61 | HR 64 | Temp 98.4°F | Resp 17 | Ht 67.0 in | Wt 136.0 lb

## 2011-08-01 DIAGNOSIS — I714 Abdominal aortic aneurysm, without rupture: Secondary | ICD-10-CM

## 2011-08-04 ENCOUNTER — Ambulatory Visit: Payer: Medicare Other | Admitting: Cardiology

## 2011-08-04 LAB — URINALYSIS, ROUTINE W REFLEX MICROSCOPIC
Bilirubin Urine: NEGATIVE
Hgb urine dipstick: NEGATIVE
Ketones, ur: NEGATIVE
Nitrite: NEGATIVE
Protein, ur: NEGATIVE
Specific Gravity, Urine: 1.025
Urobilinogen, UA: 0.2

## 2011-08-04 LAB — BASIC METABOLIC PANEL
BUN: 13
CO2: 25
Calcium: 8.6
GFR calc non Af Amer: >60
Glucose, Bld: 98

## 2011-08-04 LAB — COMPREHENSIVE METABOLIC PANEL
ALT: 31
Albumin: 2.8 — ABNORMAL LOW
Alkaline Phosphatase: 86
BUN: 18
Chloride: 105
Potassium: 3.9
Sodium: 136
Total Bilirubin: 0.9

## 2011-08-04 LAB — DIFFERENTIAL
Basophils Absolute: 0
Basophils Relative: 0
Eosinophils Absolute: 0
Eosinophils Relative: 0
Monocytes Absolute: 1.6 — ABNORMAL HIGH
Neutro Abs: 19 — ABNORMAL HIGH

## 2011-08-04 LAB — CBC
HCT: 33.6 — ABNORMAL LOW
Hemoglobin: 11.1 — ABNORMAL LOW
Hemoglobin: 11.5 — ABNORMAL LOW
MCHC: 33.5
Platelets: 125 — ABNORMAL LOW
Platelets: 135 — ABNORMAL LOW
RDW: 14.7
WBC: 21.4 — ABNORMAL HIGH

## 2011-08-04 LAB — CULTURE, BLOOD (ROUTINE X 2)
Culture: NO GROWTH
Culture: NO GROWTH

## 2011-08-04 LAB — PROTIME-INR
INR: 2.2 — ABNORMAL HIGH
INR: 3.1 — ABNORMAL HIGH
Prothrombin Time: 25.4 — ABNORMAL HIGH

## 2011-08-14 ENCOUNTER — Encounter: Payer: Self-pay | Admitting: Internal Medicine

## 2011-08-14 DIAGNOSIS — I459 Conduction disorder, unspecified: Secondary | ICD-10-CM

## 2011-08-17 ENCOUNTER — Ambulatory Visit (INDEPENDENT_AMBULATORY_CARE_PROVIDER_SITE_OTHER): Payer: Medicare Other | Admitting: *Deleted

## 2011-08-17 DIAGNOSIS — I4891 Unspecified atrial fibrillation: Secondary | ICD-10-CM

## 2011-08-28 ENCOUNTER — Telehealth: Payer: Self-pay | Admitting: Cardiology

## 2011-08-28 ENCOUNTER — Other Ambulatory Visit: Payer: Self-pay | Admitting: Cardiology

## 2011-08-28 MED ORDER — CARVEDILOL 25 MG PO TABS: ORAL_TABLET | ORAL | Status: DC

## 2011-09-05 ENCOUNTER — Encounter: Payer: Self-pay | Admitting: Internal Medicine

## 2011-09-05 ENCOUNTER — Ambulatory Visit (INDEPENDENT_AMBULATORY_CARE_PROVIDER_SITE_OTHER): Payer: Medicare Other | Admitting: Internal Medicine

## 2011-09-05 DIAGNOSIS — I4891 Unspecified atrial fibrillation: Secondary | ICD-10-CM

## 2011-09-05 DIAGNOSIS — I2510 Atherosclerotic heart disease of native coronary artery without angina pectoris: Secondary | ICD-10-CM

## 2011-09-05 DIAGNOSIS — Z950 Presence of cardiac pacemaker: Secondary | ICD-10-CM

## 2011-09-05 LAB — PACEMAKER DEVICE OBSERVATION
AL IMPEDENCE PM: 918 Ohm
ATRIAL PACING PM: 34
RV LEAD IMPEDENCE PM: 896 Ohm
RV LEAD THRESHOLD: 0.5 V
VENTRICULAR PACING PM: 61

## 2011-09-05 NOTE — Patient Instructions (Signed)
Your physician wants you to follow-up in: 12 months with Dr Court Joy will receive a reminder letter in the mail two months in advance. If you don't receive a letter, please call our office to schedule the follow-up appointment.  [employer] every month

## 2011-09-06 ENCOUNTER — Encounter: Payer: Self-pay | Admitting: Internal Medicine

## 2011-09-06 NOTE — Progress Notes (Signed)
HPI [name] returns today for followup. He is a pleasant 77 yo man with a h/o symptomatic bradycardia, s/p PPM. He remains active, despite his advanced age. He now has chronic atrial fibrillation. He denies c/p, sob, or peripheral edema. No additional complaints today. No Known Allergies   Current Outpatient Prescriptions  Medication Sig Dispense Refill  . aspirin 81 MG tablet Take 81 mg by mouth daily.        . calcium-vitamin D (OSCAL) 250-125 MG-UNIT per tablet Take 1 tablet by mouth daily.        . carvedilol (COREG) 25 MG tablet Take 1 tab twice a twice a day  180 tablet  3  . cholecalciferol (VITAMIN D) 1000 UNITS tablet Take 1,000 Units by mouth daily.        . cholestyramine (QUESTRAN) 4 G packet Take 1 packet by mouth daily after breakfast.        . dextromethorphan-guaiFENesin (MUCINEX DM) 30-600 MG per 12 hr tablet Take 1 tablet by mouth every 12 (twelve) hours as needed.        . finasteride (PROSCAR) 5 MG tablet Take 1 tablet by mouth daily.      . Fluticasone-Salmeterol (ADVAIR DISKUS) 250-50 MCG/DOSE AEPB Inhale 1 puff into the lungs every 12 (twelve) hours.        . furosemide (LASIX) 40 MG tablet Take 1 tablet by mouth daily.      Marland Kitchen guaiFENesin (MUCINEX) 600 MG 12 hr tablet Take 1,200 mg by mouth 2 (two) times daily.        . isosorbide mononitrate (IMDUR) 60 MG 24 hr tablet Take 1 and 1/2 tablets daily  180 tablet  3  . Multiple Vitamin (MULTIVITAMIN) tablet Take 1 tablet by mouth daily.        Marland Kitchen NITROSTAT 0.4 MG SL tablet Take 1 tablet by mouth Every 5 minutes as needed.      . OxyCODONE HCl, Abuse Deter, 5 MG TABS Take 1 tablet by mouth every 4 (four) hours as needed.        . pravastatin (PRAVACHOL) 20 MG tablet Take 1 tablet (20 mg total) by mouth daily.  90 tablet  3  . Probiotic Product (RESTORA PO) Take by mouth daily.        . RABEprazole (ACIPHEX) 20 MG tablet Take 20 mg by mouth daily.        . ranitidine (ZANTAC) 150 MG tablet Take 150 mg by mouth 2 (two) times  daily.        . vitamin C (ASCORBIC ACID) 500 MG tablet Take 500 mg by mouth daily.        Marland Kitchen warfarin (COUMADIN) 5 MG tablet Take by mouth as directed.          Past Medical History  Diagnosis Date  . CAD (coronary artery disease)     s/p CABG  . Asthma   . PAF (paroxysmal atrial fibrillation)   . Bradycardia   . HTN (hypertension)   . HLD (hyperlipidemia)   . AAA (abdominal aortic aneurysm)   . Orthostatic hypotension   . Dysphagia   . Myocardial infarction 1989  . Gastroesophageal reflux disease   . CHF (congestive heart failure)   . Macular degeneration     ROS:   All systems reviewed and negative except as noted in the HPI.   Past Surgical History  Procedure Date  . Coronary artery bypass graft 1996  . Pacemaker insertion 2/02    Medtronic Sigma  .  Inguinal hernia repair     right     Family History  Problem Relation Age of Onset  . Heart disease Mother   . Heart disease Father   . Heart disease Sister   . Diabetes Daughter   . Stroke Daughter   . Stroke Son      History   Social History  . Marital Status: Married    Spouse Name: N/A    Number of Children: N/A  . Years of Education: N/A   Occupational History  . Not on file.   Social History Main Topics  . Smoking status: Former Smoker -- 1.0 packs/day for 50 years    Types: Cigarettes, Cigars    Quit date: 11/13/1985  . Smokeless tobacco: Never Used  . Alcohol Use: Yes     social  . Drug Use: No  . Sexually Active: Not on file   Other Topics Concern  . Not on file   Social History Narrative  . No narrative on file     BP 114/56  Pulse 67  Ht 5\' 9"  (1.753 m)  Wt 136 lb 1.9 oz (61.744 kg)  BMI 20.10 kg/m2  Physical Exam:  Elderly, but Well appearing NAD HEENT: Unremarkable Neck:  No JVD, no thyromegally Lymphatics:  No adenopathy Back:  No CVA tenderness Lungs:  Clear with no wheezes, rales, or rhonchi. Well healed PPM incision. HEART:  Iregular rate rhythm, no murmurs,  no rubs, no clicks Abd:  soft, positive bowel sounds, no organomegally, no rebound, no guarding Ext:  2 plus pulses, no edema, no cyanosis, no clubbing Skin:  No rashes no nodules Neuro:  CN II through XII intact, motor grossly intact  DEVICE  Normal device function.  See PaceArt for details.   Assess/Plan:

## 2011-09-06 NOTE — Assessment & Plan Note (Signed)
His symptoms are well controlled. He will continue his current meds. 

## 2011-09-06 NOTE — Assessment & Plan Note (Signed)
He denies anginal symptoms. He will continue his current meds.  

## 2011-09-06 NOTE — Assessment & Plan Note (Signed)
His device is working normally and will recheck in several months. 

## 2011-09-07 ENCOUNTER — Encounter: Payer: Self-pay | Admitting: *Deleted

## 2011-09-07 ENCOUNTER — Telehealth: Payer: Self-pay | Admitting: Cardiology

## 2011-09-07 ENCOUNTER — Other Ambulatory Visit: Payer: Self-pay | Admitting: Surgery

## 2011-09-07 DIAGNOSIS — T82330A Leakage of aortic (bifurcation) graft (replacement), initial encounter: Secondary | ICD-10-CM

## 2011-09-07 NOTE — Telephone Encounter (Signed)
Pt is aware he can stop Coumadin 5 days prior to endoleak repair. Clearance faxed. He is also aware that his new coumadin appt is not until 09/29/11 at 11:45am Copy mailed to pt. [name] RN

## 2011-09-07 NOTE — Telephone Encounter (Signed)
They need a note for pt to come off coumadin for 5days prior and 2day after a endo leak repair on Nov 8th

## 2011-09-11 ENCOUNTER — Other Ambulatory Visit (HOSPITAL_COMMUNITY): Payer: Self-pay | Admitting: Interventional Radiology

## 2011-09-14 ENCOUNTER — Ambulatory Visit (INDEPENDENT_AMBULATORY_CARE_PROVIDER_SITE_OTHER): Payer: Medicare Other | Admitting: *Deleted

## 2011-09-14 ENCOUNTER — Encounter: Payer: Medicare Other | Admitting: *Deleted

## 2011-09-14 DIAGNOSIS — I4891 Unspecified atrial fibrillation: Secondary | ICD-10-CM

## 2011-09-14 LAB — POCT INR: INR: 2.3

## 2011-09-27 ENCOUNTER — Other Ambulatory Visit (HOSPITAL_COMMUNITY): Payer: Medicare Other

## 2011-09-27 ENCOUNTER — Telehealth: Payer: Self-pay | Admitting: Cardiology

## 2011-09-27 ENCOUNTER — Ambulatory Visit (HOSPITAL_COMMUNITY): Payer: Medicare Other

## 2011-09-27 ENCOUNTER — Ambulatory Visit (HOSPITAL_COMMUNITY)
Admission: RE | Admit: 2011-09-27 | Payer: Medicare Other | Source: Ambulatory Visit | Admitting: Interventional Radiology

## 2011-09-27 NOTE — Telephone Encounter (Signed)
Pt calling stating that procedure scheduled for this am was rs for 10/18/11 and pt wants to know if he needs to rs appt with Dr. Daleen Squibb or if he should keep that appt. Please return pt call to discuss further.

## 2011-09-27 NOTE — Telephone Encounter (Signed)
Pt aware of 6 month follow-up appt with Dr. Daleen Squibb and Coumadin appts  [name] RN

## 2011-09-29 ENCOUNTER — Encounter: Payer: Medicare Other | Admitting: *Deleted

## 2011-10-06 ENCOUNTER — Encounter: Payer: Medicare Other | Admitting: *Deleted

## 2011-10-06 ENCOUNTER — Encounter: Payer: Self-pay | Admitting: Internal Medicine

## 2011-10-06 DIAGNOSIS — I459 Conduction disorder, unspecified: Secondary | ICD-10-CM

## 2011-10-09 ENCOUNTER — Encounter: Payer: Medicare Other | Admitting: *Deleted

## 2011-10-10 ENCOUNTER — Ambulatory Visit (INDEPENDENT_AMBULATORY_CARE_PROVIDER_SITE_OTHER): Payer: Medicare Other | Admitting: *Deleted

## 2011-10-10 ENCOUNTER — Encounter: Payer: Self-pay | Admitting: Cardiology

## 2011-10-10 ENCOUNTER — Ambulatory Visit (INDEPENDENT_AMBULATORY_CARE_PROVIDER_SITE_OTHER): Payer: Medicare Other | Admitting: Cardiology

## 2011-10-10 VITALS — BP 120/62 | HR 65 | Ht 69.0 in | Wt 139.0 lb

## 2011-10-10 DIAGNOSIS — I714 Abdominal aortic aneurysm, without rupture: Secondary | ICD-10-CM

## 2011-10-10 DIAGNOSIS — Z950 Presence of cardiac pacemaker: Secondary | ICD-10-CM

## 2011-10-10 DIAGNOSIS — I4891 Unspecified atrial fibrillation: Secondary | ICD-10-CM

## 2011-10-10 DIAGNOSIS — I2510 Atherosclerotic heart disease of native coronary artery without angina pectoris: Secondary | ICD-10-CM

## 2011-10-10 DIAGNOSIS — I080 Rheumatic disorders of both mitral and aortic valves: Secondary | ICD-10-CM

## 2011-10-10 LAB — POCT INR: INR: 2.7

## 2011-10-10 NOTE — Progress Notes (Signed)
HPI [name] comes in today for their wish and management of his coronary disease, history of chronic A. Fib, anticoagulation, history of aortic aneurysm.  He's having no angina or ischemic symptoms. He is very limited in mobility but steady using a cane.  He can have endovascular stent repair percutaneously next week. He has endothelial intraluminal leak around the stent.  He already stopped his Coumadin. He will start back 2 days after the procedure.    Past Medical History  Diagnosis Date  . CAD (coronary artery disease)     s/p CABG  . Asthma   . PAF (paroxysmal atrial fibrillation)   . Bradycardia   . HTN (hypertension)   . HLD (hyperlipidemia)   . AAA (abdominal aortic aneurysm)   . Orthostatic hypotension   . Dysphagia   . Myocardial infarction 1989  . Gastroesophageal reflux disease   . CHF (congestive heart failure)   . Macular degeneration     Current Outpatient Prescriptions  Medication Sig Dispense Refill  . aspirin 81 MG tablet Take 81 mg by mouth daily.        . calcium-vitamin D (OSCAL) 250-125 MG-UNIT per tablet Take 1 tablet by mouth daily.        . carvedilol (COREG) 25 MG tablet Take 1 tab twice a day       . cholecalciferol (VITAMIN D) 1000 UNITS tablet Take 1,000 Units by mouth daily.        . cholestyramine (QUESTRAN) 4 G packet Take 1 packet by mouth daily after breakfast.        . dextromethorphan-guaiFENesin (MUCINEX DM) 30-600 MG per 12 hr tablet Take 1 tablet by mouth every 12 (twelve) hours as needed.        . finasteride (PROSCAR) 5 MG tablet Take 1 tablet by mouth daily.      . Fluticasone-Salmeterol (ADVAIR DISKUS) 250-50 MCG/DOSE AEPB Inhale 1 puff into the lungs every 12 (twelve) hours.        Marland Kitchen guaiFENesin (MUCINEX) 600 MG 12 hr tablet Take 1,200 mg by mouth 2 (two) times daily.        . isosorbide mononitrate (IMDUR) 60 MG 24 hr tablet Take 1 and 1/2 tablets daily  180 tablet  3  . Multiple Vitamin (MULTIVITAMIN) tablet Take 1 tablet by  mouth daily.        Marland Kitchen NITROSTAT 0.4 MG SL tablet Take 1 tablet by mouth Every 5 minutes as needed.      . OxyCODONE HCl, Abuse Deter, 5 MG TABS Take 1 tablet by mouth every 4 (four) hours as needed.        . pravastatin (PRAVACHOL) 20 MG tablet Take 1 tablet (20 mg total) by mouth daily.  90 tablet  3  . Probiotic Product (RESTORA PO) Take by mouth daily.        . RABEprazole (ACIPHEX) 20 MG tablet Take 20 mg by mouth daily.        . ranitidine (ZANTAC) 150 MG tablet Take 150 mg by mouth 2 (two) times daily.        . vitamin C (ASCORBIC ACID) 500 MG tablet Take 500 mg by mouth daily.        Marland Kitchen warfarin (COUMADIN) 5 MG tablet Take by mouth as directed.       . furosemide (LASIX) 40 MG tablet Take 1 tablet by mouth daily.        No Known Allergies  Family History  Problem Relation Age of Onset  .  Heart disease Mother   . Heart disease Father   . Heart disease Sister   . Diabetes Daughter   . Stroke Daughter   . Stroke Son     History   Social History  . Marital Status: Married    Spouse Name: N/A    Number of Children: N/A  . Years of Education: N/A   Occupational History  . Not on file.   Social History Main Topics  . Smoking status: Former Smoker -- 1.0 packs/day for 50 years    Types: Cigarettes, Cigars    Quit date: 11/13/1985  . Smokeless tobacco: Never Used  . Alcohol Use: Yes     social  . Drug Use: No  . Sexually Active: Not on file   Other Topics Concern  . Not on file   Social History Narrative  . No narrative on file    ROS ALL NEGATIVE EXCEPT THOSE NOTED IN HPI  PE  General Appearance: well developed, well nourished in no acute distress, elderly, frailHEENT: symmetrical face, PERRLA, good dentition  Neck: no JVD, thyromegaly, or adenopathy, trachea midline Chest: symmetric without deformity Cardiac: PMI non-displaced, Irregular rate and rhythm, normal S1, S2, no gallop or murmur Lung: clear to ausculation and percussion Vascular:Right carotid  bruit, reduced pulses in the lower extremities  Abdominal: nondistended, nontender, good bowel sounds, no HSM, no bruits Extremities: no cyanosis, clubbing or edema, no sign of DVT, no varicosities  Skin: normal color, no rashes Neuro: alert and oriented x 3, non-focal Pysch: normal affect  EKG Without the magnet, chronic A. Fib with ventricular pacing. With the magnet atrial spikes without capture, ventricular paced BMET    Component Value Date/Time   NA 138 03/24/2011 0537   K 4.0 DELTA CHECK NOTED REPEATED TO VERIFY 03/24/2011 0537   CL 103 03/24/2011 0537   CO2 29 03/24/2011 0537   GLUCOSE 89 03/24/2011 0537   BUN 28* 07/24/2011 0930   CREATININE 1.30 07/24/2011 0930   CREATININE 0.85 03/24/2011 0537   CALCIUM 9.1 03/24/2011 0537   GFRNONAA >60 03/24/2011 0537   GFRAA  Value: >60        The eGFR has been calculated using the MDRD equation. This calculation has not been validated in all clinical situations. eGFR's persistently <60 mL/min signify possible Chronic Kidney Disease. 03/24/2011 0537    Lipid Panel  No results found for this basename: chol, trig, hdl, cholhdl, vldl, ldlcalc    CBC    Component Value Date/Time   WBC 7.7 03/24/2011 0537   RBC 3.59* 03/24/2011 0537   HGB 11.3* 03/24/2011 0537   HCT 34.9* 03/24/2011 0537   PLT 158 03/24/2011 0537   MCV 97.2 03/24/2011 0537   MCH 31.5 03/24/2011 0537   MCHC 32.4 03/24/2011 0537   RDW 15.1 03/24/2011 0537   LYMPHSABS 1.5 03/24/2011 0537   MONOABS 0.9 03/24/2011 0537   EOSABS 0.2 03/24/2011 0537   BASOSABS 0.0 03/24/2011 0537

## 2011-10-10 NOTE — Assessment & Plan Note (Signed)
Stable. No change in treatment. 

## 2011-10-10 NOTE — Assessment & Plan Note (Signed)
Stable. Cleared for noninvasive procedure.

## 2011-10-10 NOTE — Patient Instructions (Signed)
Your physician wants you to follow-up in: 6 months with Dr. Daleen Squibb. You will receive a reminder letter in the mail two months in advance. If you don't receive a letter, please call our office to schedule the follow-up appointment.  You can stop your coumadin as directed 1 week before your procedure and then restart it 2 days after.

## 2011-10-13 ENCOUNTER — Encounter (HOSPITAL_COMMUNITY)
Admission: RE | Admit: 2011-10-13 | Discharge: 2011-10-13 | Disposition: A | Discharge status: Home / Self Care | Payer: Medicare Other | Source: Ambulatory Visit | Attending: Interventional Radiology | Admitting: Interventional Radiology

## 2011-10-13 ENCOUNTER — Encounter (HOSPITAL_COMMUNITY): Payer: Self-pay

## 2011-10-13 ENCOUNTER — Other Ambulatory Visit: Payer: Self-pay | Admitting: Radiology

## 2011-10-13 LAB — CBC
MCH: 32.2 pg (ref 26.0–34.0)
MCHC: 33 g/dL (ref 30.0–36.0)
RDW: 14.4 % (ref 11.5–15.5)

## 2011-10-13 LAB — BASIC METABOLIC PANEL
BUN: 38 mg/dL — ABNORMAL HIGH (ref 6–23)
Creatinine, Ser: 1.25 mg/dL (ref 0.50–1.35)
GFR calc Af Amer: 55 mL/min — ABNORMAL LOW (ref >90)
GFR calc non Af Amer: 47 mL/min — ABNORMAL LOW (ref >90)

## 2011-10-13 LAB — PROTIME-INR
INR: 2.5 — ABNORMAL HIGH (ref 0.00–1.49)
Prothrombin Time: 27.4 seconds — ABNORMAL HIGH (ref 11.6–15.2)

## 2011-10-13 NOTE — Consult Note (Signed)
Anesthesia:  Patient is a 77 year old male for embolization of a type II endoleak (S/P EVAR 12/21/10).  His past medical history includes CAD/MI s/p CABG, CHF, asthma, PAF on Coumadin, HTN, hyperlipidemia, orthostatic hypotension, former smoker, dysphagia, GERD, macular degeneration, and hx of PPM for bradycardia.  His Cardiologist is Dr. Daleen Squibb who cleared him for this procedure on 10/10/11.  His EKG then showed a paced rhythm.  His last stress test was in 2007.  His last echo was on 09/07/09 showing EF 50-55%, hypokinesis of the inferior myocardium, mild AS, mild MR, mod TR, bilateral atrium mildly dilated, PA peak pressure was 40mm Hg.  I was asked to review his preoperative labs.  He will need a repeat PT/PTT on the day of surgery.  I'll also order a T&S.  Plan to proceed if his follow-up labs are acceptable.

## 2011-10-13 NOTE — Pre-Procedure Instructions (Addendum)
20 Clayton Gordon  10/13/2011   Your procedure is scheduled on:  December 6  Report to [facility] at 6:00 AM.  Call this number if you have problems the morning of surgery: [phone]   Remember:   Do not eat food:After Midnight.  May have clear liquids: up to 4 Hours before arrival.  Clear liquids include soda, tea, black coffee, apple or grape juice, broth.  Take these medicines the morning of surgery with A SIP OF WATER: Imdur, Carvedilol, Nitrostat (bring to surgery)   Do not wear jewelry, make-up or nail polish.  Do not wear lotions, powders, or perfumes. You may wear deodorant.  Do not shave 48 hours prior to surgery.  Do not bring valuables to the hospital.  Contacts, dentures or bridgework may not be worn into surgery.  Leave suitcase in the car. After surgery it may be brought to your room.  For patients admitted to the hospital, checkout time is 11:00 AM the day of discharge.   Patients discharged the day of surgery will not be allowed to drive home.  Name and phone number of your driver: Britta Mccreedy [phone]  Special Instructions: CHG Shower Use Special Wash: 1/2 bottle night before surgery and 1/2 bottle morning of surgery.   Please read over the following fact sheets that you were given: Pain Booklet, Coughing and Deep Breathing and Surgical Site Infection Prevention

## 2011-10-13 NOTE — Progress Notes (Signed)
Pt has pacemaker, followed by Dr. Daleen Squibb. Per pt he is being monitored closely, pacemaker is checked every month, last check this month. ICD form faxed to Dr. [facility].   Pt stopping Coumadin today.

## 2011-10-17 NOTE — Progress Notes (Signed)
Spoke to  [place], [facility], he stated he will get consent dos. Also notified Jana Half, [employer] rep., of patient's date and time of surgery.

## 2011-10-19 ENCOUNTER — Inpatient Hospital Stay (HOSPITAL_COMMUNITY)
Admission: RE | Admit: 2011-10-19 | Discharge: 2011-10-20 | DRG: 238 | Disposition: A | Discharge status: Home / Self Care | Payer: Medicare Other | Source: Ambulatory Visit | Attending: Interventional Radiology | Admitting: Interventional Radiology

## 2011-10-19 ENCOUNTER — Encounter (HOSPITAL_COMMUNITY): Payer: Self-pay | Admitting: Vascular Surgery

## 2011-10-19 ENCOUNTER — Encounter (HOSPITAL_COMMUNITY): Payer: Self-pay

## 2011-10-19 ENCOUNTER — Other Ambulatory Visit: Payer: Self-pay | Admitting: Surgery

## 2011-10-19 ENCOUNTER — Telehealth (HOSPITAL_COMMUNITY): Payer: Self-pay | Admitting: Nurse Practitioner

## 2011-10-19 ENCOUNTER — Encounter (HOSPITAL_COMMUNITY): Payer: Self-pay | Admitting: Anesthesiology

## 2011-10-19 DIAGNOSIS — Z87891 Personal history of nicotine dependence: Secondary | ICD-10-CM

## 2011-10-19 DIAGNOSIS — K219 Gastro-esophageal reflux disease without esophagitis: Secondary | ICD-10-CM | POA: Diagnosis present

## 2011-10-19 DIAGNOSIS — I10 Essential (primary) hypertension: Secondary | ICD-10-CM | POA: Diagnosis present

## 2011-10-19 DIAGNOSIS — Z7901 Long term (current) use of anticoagulants: Secondary | ICD-10-CM

## 2011-10-19 DIAGNOSIS — T82598A Other mechanical complication of other cardiac and vascular devices and implants, initial encounter: Principal | ICD-10-CM | POA: Diagnosis present

## 2011-10-19 DIAGNOSIS — J45909 Unspecified asthma, uncomplicated: Secondary | ICD-10-CM | POA: Diagnosis present

## 2011-10-19 DIAGNOSIS — Z950 Presence of cardiac pacemaker: Secondary | ICD-10-CM

## 2011-10-19 DIAGNOSIS — T82330A Leakage of aortic (bifurcation) graft (replacement), initial encounter: Secondary | ICD-10-CM

## 2011-10-19 DIAGNOSIS — H3530 Unspecified macular degeneration: Secondary | ICD-10-CM | POA: Diagnosis present

## 2011-10-19 DIAGNOSIS — Z951 Presence of aortocoronary bypass graft: Secondary | ICD-10-CM

## 2011-10-19 DIAGNOSIS — Z79899 Other long term (current) drug therapy: Secondary | ICD-10-CM

## 2011-10-19 DIAGNOSIS — I509 Heart failure, unspecified: Secondary | ICD-10-CM | POA: Diagnosis present

## 2011-10-19 DIAGNOSIS — E785 Hyperlipidemia, unspecified: Secondary | ICD-10-CM | POA: Diagnosis present

## 2011-10-19 DIAGNOSIS — I4891 Unspecified atrial fibrillation: Secondary | ICD-10-CM | POA: Diagnosis present

## 2011-10-19 DIAGNOSIS — Z7982 Long term (current) use of aspirin: Secondary | ICD-10-CM

## 2011-10-19 DIAGNOSIS — I2510 Atherosclerotic heart disease of native coronary artery without angina pectoris: Secondary | ICD-10-CM | POA: Diagnosis present

## 2011-10-19 DIAGNOSIS — Y838 Other surgical procedures as the cause of abnormal reaction of the patient, or of later complication, without mention of misadventure at the time of the procedure: Secondary | ICD-10-CM | POA: Diagnosis present

## 2011-10-19 DIAGNOSIS — I252 Old myocardial infarction: Secondary | ICD-10-CM

## 2011-10-19 HISTORY — PX: OTHER SURGICAL HISTORY: SHX169

## 2011-10-19 HISTORY — DX: Cardiac murmur, unspecified: R01.1

## 2011-10-19 HISTORY — DX: Pneumonia, unspecified organism: J18.9

## 2011-10-19 HISTORY — DX: Noninfective gastroenteritis and colitis, unspecified: K52.9

## 2011-10-19 LAB — APTT: aPTT: 33 seconds (ref 24–37)

## 2011-10-19 LAB — PROTIME-INR: INR: 1.12 (ref 0.00–1.49)

## 2011-10-19 LAB — TYPE AND SCREEN
ABO/RH(D): A POS
Antibody Screen: NEGATIVE

## 2011-10-19 MED ORDER — ASPIRIN 81 MG PO TABS: 81.0000 mg | ORAL_TABLET | Freq: Every day | ORAL | Status: DC

## 2011-10-19 MED ORDER — FINASTERIDE 5 MG PO TABS
5.0000 mg | ORAL_TABLET | Freq: Every day | ORAL | Status: DC
  Administered 2011-10-20: 5 mg via ORAL
  Filled 2011-10-19 (×2): qty 1

## 2011-10-19 MED ORDER — CEFAZOLIN SODIUM 1-5 GM-% IV SOLN
INTRAVENOUS | Status: DC | PRN
  Administered 2011-10-19: 1 g via INTRAVENOUS

## 2011-10-19 MED ORDER — GLYCOPYRROLATE 0.2 MG/ML IJ SOLN
INTRAMUSCULAR | Status: DC | PRN
  Administered 2011-10-19: .4 mg via INTRAVENOUS

## 2011-10-19 MED ORDER — NEOSTIGMINE METHYLSULFATE 1 MG/ML IJ SOLN
INTRAMUSCULAR | Status: DC | PRN
  Administered 2011-10-19: 3 mg via INTRAVENOUS

## 2011-10-19 MED ORDER — CARVEDILOL 25 MG PO TABS
25.0000 mg | ORAL_TABLET | Freq: Two times a day (BID) | ORAL | Status: AC
  Filled 2011-10-19 (×2): qty 1

## 2011-10-19 MED ORDER — CARVEDILOL 25 MG PO TABS
25.0000 mg | ORAL_TABLET | Freq: Two times a day (BID) | ORAL | Status: DC
  Administered 2011-10-20: 25 mg via ORAL
  Filled 2011-10-19 (×3): qty 1

## 2011-10-19 MED ORDER — MEPERIDINE HCL 25 MG/ML IJ SOLN: 6.2500 mg | INTRAMUSCULAR | Status: DC | PRN

## 2011-10-19 MED ORDER — CARVEDILOL 25 MG PO TABS
25.0000 mg | ORAL_TABLET | Freq: Two times a day (BID) | ORAL | Status: DC
  Administered 2011-10-19: 25 mg via ORAL
  Filled 2011-10-19 (×2): qty 1

## 2011-10-19 MED ORDER — IOHEXOL 300 MG/ML  SOLN
100.0000 mL | Freq: Once | INTRAMUSCULAR | Status: AC | PRN
  Administered 2011-10-19: 30 mL via INTRAVENOUS

## 2011-10-19 MED ORDER — ISOSORBIDE MONONITRATE ER 60 MG PO TB24
90.0000 mg | ORAL_TABLET | Freq: Every day | ORAL | Status: AC
  Filled 2011-10-19 (×2): qty 1

## 2011-10-19 MED ORDER — GUAIFENESIN ER 600 MG PO TB12
1200.0000 mg | ORAL_TABLET | Freq: Two times a day (BID) | ORAL | Status: DC
  Administered 2011-10-19 – 2011-10-20 (×2): 1200 mg via ORAL
  Filled 2011-10-19 (×4): qty 2

## 2011-10-19 MED ORDER — HEPARIN SODIUM (PORCINE) 1000 UNIT/ML IJ SOLN
INTRAMUSCULAR | Status: AC
  Filled 2011-10-19: qty 1

## 2011-10-19 MED ORDER — CHOLESTYRAMINE 4 G PO PACK
1.0000 | PACK | Freq: Every day | ORAL | Status: DC
  Filled 2011-10-19 (×2): qty 1

## 2011-10-19 MED ORDER — FAMOTIDINE 10 MG PO TABS
10.0000 mg | ORAL_TABLET | Freq: Every day | ORAL | Status: DC
  Administered 2011-10-20: 10 mg via ORAL
  Filled 2011-10-19: qty 0.5
  Filled 2011-10-19: qty 1

## 2011-10-19 MED ORDER — SIMVASTATIN 20 MG PO TABS
20.0000 mg | ORAL_TABLET | Freq: Every day | ORAL | Status: DC
  Administered 2011-10-19: 20 mg via ORAL
  Filled 2011-10-19 (×2): qty 1

## 2011-10-19 MED ORDER — ASPIRIN EC 81 MG PO TBEC
81.0000 mg | DELAYED_RELEASE_TABLET | Freq: Every day | ORAL | Status: DC
  Administered 2011-10-20: 81 mg via ORAL
  Filled 2011-10-19: qty 1

## 2011-10-19 MED ORDER — SODIUM CHLORIDE 0.45 % IV SOLN: INTRAVENOUS | Status: DC

## 2011-10-19 MED ORDER — FUROSEMIDE 40 MG PO TABS
40.0000 mg | ORAL_TABLET | Freq: Every day | ORAL | Status: DC
  Administered 2011-10-20: 40 mg via ORAL
  Filled 2011-10-19 (×2): qty 1

## 2011-10-19 MED ORDER — ROCURONIUM BROMIDE 100 MG/10ML IV SOLN
INTRAVENOUS | Status: DC | PRN
  Administered 2011-10-19: 10 mg via INTRAVENOUS
  Administered 2011-10-19: 40 mg via INTRAVENOUS

## 2011-10-19 MED ORDER — FENTANYL CITRATE 0.05 MG/ML IJ SOLN: 25.0000 ug | INTRAMUSCULAR | Status: DC | PRN

## 2011-10-19 MED ORDER — FENTANYL CITRATE 0.05 MG/ML IJ SOLN
INTRAMUSCULAR | Status: DC | PRN
  Administered 2011-10-19: 50 ug via INTRAVENOUS
  Administered 2011-10-19: 100 ug via INTRAVENOUS

## 2011-10-19 MED ORDER — PROPOFOL 10 MG/ML IV EMUL
INTRAVENOUS | Status: DC | PRN
  Administered 2011-10-19: 100 mg via INTRAVENOUS

## 2011-10-19 MED ORDER — NITROGLYCERIN 0.4 MG SL SUBL: 0.4000 mg | SUBLINGUAL_TABLET | SUBLINGUAL | Status: DC | PRN

## 2011-10-19 MED ORDER — GELATIN ABSORBABLE 12-7 MM EX MISC
CUTANEOUS | Status: AC
  Filled 2011-10-19: qty 1

## 2011-10-19 MED ORDER — LACTATED RINGERS IV SOLN
INTRAVENOUS | Status: DC | PRN
  Administered 2011-10-19: 09:00:00 via INTRAVENOUS

## 2011-10-19 MED ORDER — PANTOPRAZOLE SODIUM 40 MG PO TBEC: 40.0000 mg | DELAYED_RELEASE_TABLET | Freq: Every day | ORAL | Status: DC

## 2011-10-19 MED ORDER — SODIUM CHLORIDE 0.9 % IV SOLN
10.0000 mg | INTRAVENOUS | Status: DC | PRN
  Administered 2011-10-19: 25 ug/min via INTRAVENOUS

## 2011-10-19 MED ORDER — ISOSORBIDE MONONITRATE ER 60 MG PO TB24
90.0000 mg | ORAL_TABLET | Freq: Every day | ORAL | Status: DC
  Administered 2011-10-20: 90 mg via ORAL
  Filled 2011-10-19: qty 1

## 2011-10-19 MED ORDER — FLUTICASONE-SALMETEROL 250-50 MCG/DOSE IN AEPB
1.0000 | INHALATION_SPRAY | Freq: Two times a day (BID) | RESPIRATORY_TRACT | Status: DC
  Administered 2011-10-20: 1 via RESPIRATORY_TRACT
  Filled 2011-10-19: qty 14

## 2011-10-19 MED ORDER — HYDROCODONE-ACETAMINOPHEN 5-325 MG PO TABS: 1.0000 | ORAL_TABLET | ORAL | Status: DC | PRN

## 2011-10-19 MED ORDER — CEFAZOLIN SODIUM 1-5 GM-% IV SOLN: 1.0000 g | Freq: Once | INTRAVENOUS | Status: DC

## 2011-10-19 NOTE — Transfer of Care (Cosign Needed)
Immediate Anesthesia Transfer of Care Note  Patient: Clayton Gordon  Procedure(s) Performed: * No procedures listed *  Patient Location: PACU  Anesthesia Type: General  Level of Consciousness: awake, alert  and oriented  Airway & Oxygen Therapy: Patient Spontanous Breathing and Patient connected to nasal cannula oxygen  Post-op Assessment: Report given to PACU RN, Post -op Vital signs reviewed and stable and Patient moving all extremities X 4  Post vital signs: Reviewed and stable  Complications: No apparent anesthesia complications

## 2011-10-19 NOTE — Anesthesia Procedure Notes (Signed)
Procedure Name: Intubation Date/Time: 10/19/2011 9:17 AM Performed by: Rosita Fire Pre-anesthesia Checklist: Patient identified, Patient being monitored, Timeout performed, Emergency Drugs available and Suction available Patient Re-evaluated:Patient Re-evaluated prior to inductionOxygen Delivery Method: Circle System Utilized Preoxygenation: Pre-oxygenation with 100% oxygen Intubation Type: IV induction Ventilation: Mask ventilation without difficulty Laryngoscope Size: Miller and 2 Grade View: Grade II Tube type: Oral Tube size: 7.0 mm Number of attempts: 1 Placement Confirmation: ETT inserted through vocal cords under direct vision,  breath sounds checked- equal and bilateral and positive ETCO2

## 2011-10-19 NOTE — Preoperative (Signed)
Beta Blockers   Reason not to administer Beta Blockers:Not Applicable 

## 2011-10-19 NOTE — Procedures (Signed)
Procedure:  Translumbar abdominal aortogram and embolization of aortic endoleak Anesthesia:  General Assitant:  Dr. Kerby Nora Findings:  Left translumbar puncture and access with Accustick set.  Aortogram shows endoleak cavity without visible reflux into feeding vessel.  Coaxial microcatheter placed and embolization performed with EV3 Concerto microcoils.  Total of 14 coils utilized.  Gelfoam tract embolization. Plan:  PACU recovery; overnight observation.  Bedrest next 6 hours.

## 2011-10-19 NOTE — H&P (Signed)
Clayton Gordon is an 77 y.o. male.   Chief Complaint: Abdominal Aortic Aneurysm ; continued enlargement even after stent placement; positive endoleak HPI: Scheduled for Percutaneous Aortic Endoleak Embolization  Past Medical History  Diagnosis Date  . CAD (coronary artery disease)     s/p CABG  . Asthma   . PAF (paroxysmal atrial fibrillation)   . Bradycardia   . HTN (hypertension)   . HLD (hyperlipidemia)   . AAA (abdominal aortic aneurysm)   . Orthostatic hypotension   . Dysphagia   . Myocardial infarction 1989  . Gastroesophageal reflux disease   . CHF (congestive heart failure)   . Macular degeneration     Past Surgical History  Procedure Date  . Coronary artery bypass graft 1996  . Pacemaker insertion 2/02    Medtronic Sigma  . Inguinal hernia repair     right  . Abdominal aortic aneurysm repair 2012  . Back surgery     Family History  Problem Relation Age of Onset  . Heart disease Mother   . Heart disease Father   . Heart disease Sister   . Diabetes Daughter   . Stroke Daughter   . Stroke Son    Social History:  reports that he quit smoking about 25 years ago. His smoking use included Cigarettes and Cigars. He has a 50 pack-year smoking history. He has never used smokeless tobacco. He reports that he drinks alcohol. He reports that he does not use illicit drugs.  Allergies: No Known Allergies  Medications Prior to Admission  Medication Sig Dispense Refill  . calcium-vitamin D (OSCAL) 250-125 MG-UNIT per tablet Take 1 tablet by mouth daily.        . carvedilol (COREG) 25 MG tablet Take 1 tab twice a day       . cholecalciferol (VITAMIN D) 1000 UNITS tablet Take 1,000 Units by mouth daily.        Marland Kitchen dextromethorphan-guaiFENesin (MUCINEX DM) 30-600 MG per 12 hr tablet Take 1 tablet by mouth every 12 (twelve) hours as needed.        . finasteride (PROSCAR) 5 MG tablet Take 1 tablet by mouth daily.      . Fluticasone-Salmeterol (ADVAIR DISKUS) 250-50 MCG/DOSE  AEPB Inhale 1 puff into the lungs every 12 (twelve) hours.        . furosemide (LASIX) 40 MG tablet Take 1 tablet by mouth daily.      Marland Kitchen guaiFENesin (MUCINEX) 600 MG 12 hr tablet Take 1,200 mg by mouth 2 (two) times daily.        . isosorbide mononitrate (IMDUR) 60 MG 24 hr tablet Take 1 and 1/2 tablets daily  180 tablet  3  . Multiple Vitamin (MULTIVITAMIN) tablet Take 1 tablet by mouth daily.        . pravastatin (PRAVACHOL) 20 MG tablet Take 1 tablet (20 mg total) by mouth daily.  90 tablet  3  . Probiotic Product (RESTORA PO) Take by mouth daily.        . RABEprazole (ACIPHEX) 20 MG tablet Take 20 mg by mouth daily.        . ranitidine (ZANTAC) 150 MG tablet Take 150 mg by mouth 2 (two) times daily.        . vitamin C (ASCORBIC ACID) 500 MG tablet Take 500 mg by mouth daily.        Marland Kitchen warfarin (COUMADIN) 5 MG tablet Take 2.5-5 mg by mouth as directed. Take 1/2 a tablet on Monday, Wednesday and  Friday Take 1 tablet the rest of the week      . aspirin 81 MG tablet Take 81 mg by mouth daily.        . cholestyramine (QUESTRAN) 4 G packet Take 1 packet by mouth daily after breakfast.        . NITROSTAT 0.4 MG SL tablet Take 1 tablet by mouth Every 5 minutes as needed.       Medications Prior to Admission  Medication Dose Route Frequency Provider Last Rate Last Dose  . ceFAZolin (ANCEF) IVPB 1 g/50 mL premix  1 g Intravenous Once Sanjeev K Deveshwar        No results found for this or any previous visit (from the past 48 hour(s)). No results found.  Review of Systems  Constitutional: Negative for fever and chills.  HENT: Positive for hearing loss.   Eyes: Negative for blurred vision.  Respiratory: Negative for cough.   Cardiovascular: Negative for chest pain.  Gastrointestinal: Negative for nausea, vomiting, abdominal pain and diarrhea.  Neurological: Negative for dizziness and headaches.    Blood pressure 127/64, pulse 64, temperature 98.2 F (36.8 C), temperature source Oral, resp.  rate 18, SpO2 100.00%. Physical Exam  Constitutional: He is oriented to person, place, and time. He appears well-developed and well-nourished.  HENT:  Head: Normocephalic.  Eyes: EOM are normal.  Neck: Normal range of motion.  Cardiovascular: Normal rate and regular rhythm.   Murmur heard.      2/6 holosystolic  Respiratory: Effort normal and breath sounds normal. He has no wheezes.  GI: Soft. Bowel sounds are normal. He exhibits no mass. There is no tenderness.  Musculoskeletal: Normal range of motion.       Gait slow.uses cane  Neurological: He is alert and oriented to person, place, and time.  Skin: Skin is warm and dry.     Assessment/Plan 77 yo male with HX of AAA repair with continued enlargement of Aneurysmal sac/ + Endoleak Scheduled now for Aortic Endoleak Embolization with Dr Irish Lack and Dr Corliss Skains. Pt aware of procedure benefits and risks and agreeable to proceed. Consent signed. Pt will be possibly admitted overnight for observation post procedure. Secily Walthour A 10/19/2011, 7:57 AM

## 2011-10-19 NOTE — Anesthesia Preprocedure Evaluation (Addendum)
Anesthesia Evaluation  Patient identified by MRN, date of birth, ID band Patient awake    Reviewed: Allergy & Precautions, H&P , NPO status , Patient's Chart, lab work & pertinent test results, reviewed documented beta blocker date and time   Airway Mallampati: II TM Distance: <3 FB     Dental  (+) Teeth Intact, Partial Lower and Partial Upper   Pulmonary asthma ,          Cardiovascular hypertension, Pt. on medications + CAD, + Past MI and +CHF AS and MR Regular Normal+ Systolic murmurs    Neuro/Psych    GI/Hepatic GERD-  Controlled,  Endo/Other    Renal/GU      Musculoskeletal   Abdominal (+) scaphoid    Peds  Hematology   Anesthesia Other Findings   Reproductive/Obstetrics                          Anesthesia Physical Anesthesia Plan  ASA: III  Anesthesia Plan: General   Post-op Pain Management:    Induction: Intravenous  Airway Management Planned: Oral ETT  Additional Equipment: Arterial line  Intra-op Plan:   Post-operative Plan: Extubation in OR  Informed Consent: I have reviewed the patients History and Physical, chart, labs and discussed the procedure including the risks, benefits and alternatives for the proposed anesthesia with the patient or authorized representative who has indicated his/her understanding and acceptance.   Dental advisory given  Plan Discussed with: CRNA and Surgeon  Anesthesia Plan Comments:        Anesthesia Quick Evaluation

## 2011-10-19 NOTE — H&P (Signed)
Agree with above.  Patient scheduled for percutaneous endoleak repair today under general anesthesia.

## 2011-10-20 LAB — CBC
HCT: 32.5 % — ABNORMAL LOW (ref 39.0–52.0)
MCHC: 32.3 g/dL (ref 30.0–36.0)
MCV: 97.9 fL (ref 78.0–100.0)
Platelets: 106 10*3/uL — ABNORMAL LOW (ref 150–400)
RDW: 14.9 % (ref 11.5–15.5)

## 2011-10-20 LAB — BASIC METABOLIC PANEL
BUN: 25 mg/dL — ABNORMAL HIGH (ref 6–23)
CO2: 27 mEq/L (ref 19–32)
Chloride: 108 mEq/L (ref 96–112)
Creatinine, Ser: 1.04 mg/dL (ref 0.50–1.35)

## 2011-10-20 MED ORDER — ACETAMINOPHEN 325 MG PO TABS: 650.0000 mg | ORAL_TABLET | Freq: Four times a day (QID) | ORAL | Status: DC | PRN

## 2011-10-20 NOTE — Progress Notes (Signed)
  Subjective: AAA endoleak embolization performed 12/6 by Dr Fredia Sorrow and Dr Corliss Skains. Pt tolerated well without complication. Overnight stay has been uneventful. Pt has L hip pain from being in bed-no other complaints. Tmax 99.9 Eating and drinking well. Urinating w/o difficulty; passing gas. Last BM 12/5 at midnight.  Objective: Vital signs in last 24 hours: Temp:  [97.1 F (36.2 C)-99.9 F (37.7 C)] 99.9 F (37.7 C) (12/07 0620) Pulse Rate:  [60-91] 66  (12/07 0620) Resp:  [15-20] 20  (12/07 0620) BP: (93-141)/(49-70) 141/70 mmHg (12/07 0620) SpO2:  [94 %-99 %] 95 % (12/07 0620) Arterial Line BP: (108-128)/(43-52) 126/49 mmHg (12/06 1430) Last BM Date: 10/19/11  Intake/Output from previous day: 12/06 0701 - 12/07 0700 In: 180 [P.O.:180] Out: 25 [Urine:25] Intake/Output this shift:    PE:  Site of embolization at low back is NT; no sign of hematoma. Small dime size are of old blood. No sign of infection. Neuro intact. Pleasant and rested  Lab Results:  No results found for this basename: WBC:2,HGB:2,HCT:2,PLT:2 in the last 72 hours BMET No results found for this basename: NA:2,K:2,CL:2,CO2:2,GLUCOSE:2,BUN:2,CREATININE:2,CALCIUM:2 in the last 72 hours PT/INR  Basename 10/19/11 0729  LABPROT 14.6  INR 1.12   ABG No results found for this basename: PHART:2,PCO2:2,PO2:2,HCO3:2 in the last 72 hours  Studies/Results: No results found.  Anti-infectives: Anti-infectives     Start     Dose/Rate Route Frequency Ordered Stop   10/19/11 0730   ceFAZolin (ANCEF) IVPB 1 g/50 mL premix  Status:  Discontinued        1 g 100 mL/hr over 30 Minutes Intravenous  Once 10/19/11 0727 10/19/11 1311          Assessment/Plan: s/p  AAA endoleak embolization 12/6 in IR. Pt doing quite well. Will treat hip pain with Tylenol as per pts request. Plan for probable dc today. Will discuss pt status with Dr Fredia Sorrow and Dr Corliss Skains.  LOS: 1 day    Alix Lahmann  A 10/20/2011

## 2011-10-20 NOTE — Discharge Summary (Signed)
Physician Discharge Summary  Patient ID: Clayton Gordon MRN: [id number] DOB/AGE: 75-Aug-1916 75 y.o.  Admit date: 10/19/2011 Discharge date: 10/20/2011  Admission Diagnoses: Abdominal Aorta Aneurysm with previous stent placement. Development of Endoleak  Discharge Diagnoses: Aortic Endoleak Embolization  Additional Diagnosis: CAD/MI; Asthma; PAF; HTN; HLD; orthostatic Hypotension; AAA; Dysphagia; GERD; CHF; Macular Degeneration; Heart Murmur  Discharged Condition: Improved  Hospital Course: 77 yr old male with history of AAA. Stent placed but endoleak developed and enlarging per CT.   Pt underwent Aortic Endoleak Embolization 12/6/with Dr Fredia Sorrow and Dr Corliss Skains in IR without complication. Overnight stay was uneventful. Pt has no complaints and no pain. Eating and drinking well. Urinating and has had bowel movement.   Consults: None  Significant Diagnostic Studies: Aortagram 12/6  Treatments: Aortic Endoleak Embolization  Discharge Exam: Blood pressure 141/70, pulse 66, temperature 99.9 F (37.7 C), temperature source Oral, resp. rate 20, SpO2 95.00%.   PE:  Heart: RRR with 3/6 murmur Lungs: CTA ABD: soft +BS; NT; no masses Ext: FROM; Gait steady Site of embolization: w/o redness; NT; no bleeding; no sign of infection VSS  Results for orders placed during the hospital encounter of 10/19/11  APTT      Component Value Range   aPTT 33  24 - 37 (seconds)  PROTIME-INR      Component Value Range   Prothrombin Time 14.6  11.6 - 15.2 (seconds)   INR 1.12  0.00 - 1.49   TYPE AND SCREEN      Component Value Range   ABO/RH(D) A POS     Antibody Screen NEG     Sample Expiration 10/22/2011    CBC      Component Value Range   WBC 7.8  4.0 - 10.5 (K/uL)   RBC 3.32 (*) 4.22 - 5.81 (MIL/uL)   Hemoglobin 10.5 (*) 13.0 - 17.0 (g/dL)   HCT 40.9 (*) 81.1 - 52.0 (%)   MCV 97.9  78.0 - 100.0 (fL)   MCH 31.6  26.0 - 34.0 (pg)   MCHC 32.3  30.0 - 36.0 (g/dL)   RDW 91.4  78.2 -  95.6 (%)   Platelets 106 (*) 150 - 400 (K/uL)  BASIC METABOLIC PANEL      Component Value Range   Sodium 140  135 - 145 (mEq/L)   Potassium 4.6  3.5 - 5.1 (mEq/L)   Chloride 108  96 - 112 (mEq/L)   CO2 27  19 - 32 (mEq/L)   Glucose, Bld 108 (*) 70 - 99 (mg/dL)   BUN 25 (*) 6 - 23 (mg/dL)   Creatinine, Ser 2.13  0.50 - 1.35 (mg/dL)   Calcium 9.1  8.4 - 08.6 (mg/dL)   GFR calc non Af Amer 59 (*) >90 (mL/min)   GFR calc Af Amer 69 (*) >90 (mL/min)      Disposition: Home or Self Care  Discharge Orders    Future Appointments: Provider: Department: Dept Phone: Center:   11/02/2011 11:45 AM Raul Del, RN [facility] [phone] None   01/29/2012 12:00 PM V Durene Cal, MD [facility] [phone] [facility]     Future Orders Please Complete By Expires   Diet - low sodium heart healthy      Increase activity slowly      Discharge instructions      Comments:   Restart coumadin 12/8    Driving Restrictions      Comments:   No driving until 40/10   Lifting restrictions  Comments:   No lifting over 10 lbs for 48 hrs   Remove dressing in 24 hours      Call MD for:  severe uncontrolled pain      Call MD for:  redness, tenderness, or signs of infection (pain, swelling, redness, odor or green/yellow discharge around incision site)        Current Discharge Medication List    CONTINUE these medications which have NOT CHANGED   Details  calcium-vitamin D (OSCAL WITH D) 500-200 MG-UNIT per tablet Take 1 tablet by mouth daily.      carvedilol (COREG) 25 MG tablet Take 1 tab twice a day     cholecalciferol (VITAMIN D) 1000 UNITS tablet Take 1,000 Units by mouth daily.      dextromethorphan-guaiFENesin (MUCINEX DM) 30-600 MG per 12 hr tablet Take 1 tablet by mouth every 12 (twelve) hours as needed.     finasteride (PROSCAR) 5 MG tablet Take 1 tablet by mouth daily.    Fluticasone-Salmeterol (ADVAIR DISKUS) 250-50 MCG/DOSE AEPB Inhale 1 puff into the lungs every 12  (twelve) hours.      furosemide (LASIX) 40 MG tablet Take 1 tablet by mouth daily.    guaiFENesin (MUCINEX) 600 MG 12 hr tablet Take 1,200 mg by mouth 2 (two) times daily.      isosorbide mononitrate (IMDUR) 60 MG 24 hr tablet Take 90 mg by mouth daily. Takes 1 & 1/2 tab daily     Multiple Vitamin (MULTIVITAMIN) tablet Take 1 tablet by mouth daily.      pravastatin (PRAVACHOL) 20 MG tablet Take 20 mg by mouth daily.      Probiotic Product (RESTORA PO) Take by mouth daily.     RABEprazole (ACIPHEX) 20 MG tablet Take 20 mg by mouth daily.      ranitidine (ZANTAC) 150 MG tablet Take 150 mg by mouth 2 (two) times daily.      vitamin C (ASCORBIC ACID) 500 MG tablet Take 500 mg by mouth daily.      warfarin (COUMADIN) 5 MG tablet Take 2.5-5 mg by mouth as directed. Take 1/2 a tablet on Monday, Wednesday and Friday Take 1 tablet the rest of the week    aspirin 81 MG tablet Take 81 mg by mouth daily.      cholestyramine (QUESTRAN) 4 G packet Take 1 packet by mouth daily after breakfast.      nitroGLYCERIN (NITROSTAT) 0.4 MG SL tablet Place 0.4 mg under the tongue every 5 (five) minutes as needed. For chest pain        Follow-up Information    Follow up with [facility] (scheduler will call pt with appt for 2-3 week follow up)    Contact information:   [address] [place] [address] [phone]          Signed: Robet Leu 10/20/2011, 1:37 PM

## 2011-10-20 NOTE — Discharge Summary (Signed)
Agree 

## 2011-10-20 NOTE — Progress Notes (Signed)
Utilization review complete 

## 2011-10-20 NOTE — Progress Notes (Signed)
Received spiritual care consult for pt.  Visited with patient and wife who were waiting for discharge papers to be drawn up.  They seemed in good spirits and did not voice any particular concerns in this visit.  Thornell Sartorius 12:29 PM   10/20/11 1200  Clinical Encounter Type  Visited With Patient;Family  Referral From Nurse

## 2011-10-23 ENCOUNTER — Other Ambulatory Visit: Payer: Self-pay | Admitting: Interventional Radiology

## 2011-10-23 DIAGNOSIS — T82330A Leakage of aortic (bifurcation) graft (replacement), initial encounter: Secondary | ICD-10-CM

## 2011-10-23 DIAGNOSIS — Z8679 Personal history of other diseases of the circulatory system: Secondary | ICD-10-CM

## 2011-10-31 ENCOUNTER — Other Ambulatory Visit: Payer: Self-pay

## 2011-10-31 ENCOUNTER — Emergency Department (HOSPITAL_COMMUNITY): Payer: Medicare Other

## 2011-10-31 ENCOUNTER — Encounter (HOSPITAL_COMMUNITY): Payer: Self-pay | Admitting: Emergency Medicine

## 2011-10-31 ENCOUNTER — Observation Stay (HOSPITAL_COMMUNITY)
Admission: EM | Admit: 2011-10-31 | Discharge: 2011-11-03 | Disposition: A | Discharge status: Home / Self Care | Payer: Medicare Other | Attending: Family Medicine | Admitting: Family Medicine

## 2011-10-31 DIAGNOSIS — J45901 Unspecified asthma with (acute) exacerbation: Secondary | ICD-10-CM

## 2011-10-31 DIAGNOSIS — I379 Nonrheumatic pulmonary valve disorder, unspecified: Secondary | ICD-10-CM | POA: Insufficient documentation

## 2011-10-31 DIAGNOSIS — R1310 Dysphagia, unspecified: Secondary | ICD-10-CM | POA: Insufficient documentation

## 2011-10-31 DIAGNOSIS — Z7901 Long term (current) use of anticoagulants: Secondary | ICD-10-CM | POA: Insufficient documentation

## 2011-10-31 DIAGNOSIS — I2510 Atherosclerotic heart disease of native coronary artery without angina pectoris: Secondary | ICD-10-CM

## 2011-10-31 DIAGNOSIS — Z79899 Other long term (current) drug therapy: Secondary | ICD-10-CM | POA: Insufficient documentation

## 2011-10-31 DIAGNOSIS — I252 Old myocardial infarction: Secondary | ICD-10-CM | POA: Insufficient documentation

## 2011-10-31 DIAGNOSIS — K219 Gastro-esophageal reflux disease without esophagitis: Secondary | ICD-10-CM

## 2011-10-31 DIAGNOSIS — R1319 Other dysphagia: Secondary | ICD-10-CM | POA: Diagnosis present

## 2011-10-31 DIAGNOSIS — E785 Hyperlipidemia, unspecified: Secondary | ICD-10-CM

## 2011-10-31 DIAGNOSIS — Z950 Presence of cardiac pacemaker: Secondary | ICD-10-CM

## 2011-10-31 DIAGNOSIS — I951 Orthostatic hypotension: Secondary | ICD-10-CM | POA: Diagnosis present

## 2011-10-31 DIAGNOSIS — I059 Rheumatic mitral valve disease, unspecified: Secondary | ICD-10-CM | POA: Insufficient documentation

## 2011-10-31 DIAGNOSIS — J441 Chronic obstructive pulmonary disease with (acute) exacerbation: Secondary | ICD-10-CM | POA: Insufficient documentation

## 2011-10-31 DIAGNOSIS — R05 Cough: Secondary | ICD-10-CM | POA: Insufficient documentation

## 2011-10-31 DIAGNOSIS — R0602 Shortness of breath: Secondary | ICD-10-CM | POA: Insufficient documentation

## 2011-10-31 DIAGNOSIS — R0989 Other specified symptoms and signs involving the circulatory and respiratory systems: Secondary | ICD-10-CM

## 2011-10-31 DIAGNOSIS — Z951 Presence of aortocoronary bypass graft: Secondary | ICD-10-CM | POA: Insufficient documentation

## 2011-10-31 DIAGNOSIS — N400 Enlarged prostate without lower urinary tract symptoms: Secondary | ICD-10-CM | POA: Insufficient documentation

## 2011-10-31 DIAGNOSIS — Z87898 Personal history of other specified conditions: Secondary | ICD-10-CM

## 2011-10-31 DIAGNOSIS — I079 Rheumatic tricuspid valve disease, unspecified: Secondary | ICD-10-CM | POA: Insufficient documentation

## 2011-10-31 DIAGNOSIS — R079 Chest pain, unspecified: Secondary | ICD-10-CM | POA: Diagnosis present

## 2011-10-31 DIAGNOSIS — I4891 Unspecified atrial fibrillation: Secondary | ICD-10-CM | POA: Insufficient documentation

## 2011-10-31 DIAGNOSIS — R0789 Other chest pain: Principal | ICD-10-CM | POA: Insufficient documentation

## 2011-10-31 DIAGNOSIS — H3530 Unspecified macular degeneration: Secondary | ICD-10-CM

## 2011-10-31 DIAGNOSIS — R059 Cough, unspecified: Secondary | ICD-10-CM | POA: Insufficient documentation

## 2011-10-31 LAB — POCT I-STAT TROPONIN I: Troponin i, poc: 0.04 ng/mL (ref 0.00–0.08)

## 2011-10-31 LAB — CBC
HCT: 33 % — ABNORMAL LOW (ref 39.0–52.0)
MCHC: 32.7 g/dL (ref 30.0–36.0)
MCV: 97.1 fL (ref 78.0–100.0)
RDW: 14.5 % (ref 11.5–15.5)

## 2011-10-31 LAB — BASIC METABOLIC PANEL
BUN: 30 mg/dL — ABNORMAL HIGH (ref 6–23)
Chloride: 105 mEq/L (ref 96–112)
Creatinine, Ser: 1.32 mg/dL (ref 0.50–1.35)
GFR calc Af Amer: 52 mL/min — ABNORMAL LOW (ref >90)
GFR calc non Af Amer: 44 mL/min — ABNORMAL LOW (ref >90)
Glucose, Bld: 136 mg/dL — ABNORMAL HIGH (ref 70–99)

## 2011-10-31 LAB — CK TOTAL AND CKMB (NOT AT ARMC)
Relative Index: INVALID (ref 0.0–2.5)
Total CK: 51 U/L (ref 7–232)

## 2011-10-31 LAB — TROPONIN I: Troponin I: <0.3 ng/mL (ref <0.30)

## 2011-10-31 MED ORDER — NITROGLYCERIN 0.4 MG SL SUBL
0.4000 mg | SUBLINGUAL_TABLET | SUBLINGUAL | Status: AC | PRN
  Administered 2011-10-31 (×3): 0.4 mg via SUBLINGUAL
  Filled 2011-10-31: qty 25
  Filled 2011-10-31: qty 75

## 2011-10-31 MED ORDER — ALBUTEROL SULFATE (5 MG/ML) 0.5% IN NEBU
5.0000 mg | INHALATION_SOLUTION | Freq: Once | RESPIRATORY_TRACT | Status: AC
  Administered 2011-10-31: 5 mg via RESPIRATORY_TRACT
  Filled 2011-10-31: qty 1
  Filled 2011-10-31: qty 0.5

## 2011-10-31 MED ORDER — METHYLPREDNISOLONE SODIUM SUCC 125 MG IJ SOLR
125.0000 mg | Freq: Once | INTRAMUSCULAR | Status: AC
  Administered 2011-10-31: 125 mg via INTRAVENOUS
  Filled 2011-10-31: qty 2

## 2011-10-31 MED ORDER — NITROGLYCERIN IN D5W 200-5 MCG/ML-% IV SOLN
2.0000 ug/min | Freq: Once | INTRAVENOUS | Status: AC
  Administered 2011-10-31: 5 ug/min via INTRAVENOUS
  Filled 2011-10-31: qty 250

## 2011-10-31 MED ORDER — ASPIRIN 325 MG PO TABS
325.0000 mg | ORAL_TABLET | Freq: Once | ORAL | Status: AC
  Administered 2011-10-31: 325 mg via ORAL
  Filled 2011-10-31: qty 1

## 2011-10-31 MED ORDER — MORPHINE SULFATE 4 MG/ML IJ SOLN
4.0000 mg | Freq: Once | INTRAMUSCULAR | Status: AC
  Administered 2011-10-31: 4 mg via INTRAVENOUS
  Filled 2011-10-31: qty 1

## 2011-10-31 MED ORDER — MOXIFLOXACIN HCL IN NACL 400 MG/250ML IV SOLN
400.0000 mg | Freq: Once | INTRAVENOUS | Status: AC
  Administered 2011-10-31: 400 mg via INTRAVENOUS
  Filled 2011-10-31: qty 250

## 2011-10-31 MED ORDER — IPRATROPIUM BROMIDE 0.02 % IN SOLN
0.5000 mg | Freq: Once | RESPIRATORY_TRACT | Status: AC
  Administered 2011-10-31: 0.5 mg via RESPIRATORY_TRACT
  Filled 2011-10-31: qty 2.5

## 2011-10-31 MED ORDER — ALBUTEROL SULFATE (5 MG/ML) 0.5% IN NEBU
5.0000 mg | INHALATION_SOLUTION | Freq: Once | RESPIRATORY_TRACT | Status: AC
  Administered 2011-10-31: 5 mg via RESPIRATORY_TRACT
  Filled 2011-10-31: qty 0.5

## 2011-10-31 NOTE — ED Notes (Signed)
[id number]<WG> Expected date:10/31/11<BR> Expected time: 1:57 PM<BR> Means of arrival:Ambulance<BR> Comments:<BR> EMS 33 GC - possible aspiration pneumonia

## 2011-10-31 NOTE — ED Provider Notes (Signed)
History     CSN: [id number] Arrival date & time: 10/31/2011  2:03 PM   First MD Initiated Contact with Patient 10/31/11 1518      Chief Complaint  Patient presents with  . Dysphagia    (Consider location/radiation/quality/duration/timing/severity/associated sxs/prior treatment) HPI Patient presenting with complaint of chest pain and shortness of breath after choking episode at his nursing facility. He states he was eating beef tips and began to choke. He estimates that coughing and choking lasted approximately one hour. He did not lose consciousness. He has mild cough now and does not complain of midsternal chest pain and pressure. He states that pain is approximately 6/10. It does radiate to his left shoulder. He had no diaphoresis or nausea associated. He states that he felt in his usual state of health prior to symptoms beginning with his cough while eating. He has a history of dysphasia in the past. He does not feel a foreign body sensation in his esophagus. He has been able to drink liquids normally since the choking episode. He does have a history of coronary artery disease as well as asthma. There no other alleviating or modifying factors. There no other associated systemic symptoms  Past Medical History  Diagnosis Date  . CAD (coronary artery disease)     s/p CABG  . Asthma   . PAF (paroxysmal atrial fibrillation)   . Bradycardia   . HTN (hypertension)   . HLD (hyperlipidemia)   . AAA (abdominal aortic aneurysm)   . Orthostatic hypotension   . Dysphagia   . Gastroesophageal reflux disease   . CHF (congestive heart failure)   . Macular degeneration   . Heart murmur   . Myocardial infarction 1989  . Pneumonia     "3 times 2010"  . Chronic diarrhea     Past Surgical History  Procedure Date  . Pacemaker insertion 2/02    Medtronic Sigma  . Inguinal hernia repair     right  . Abdominal aortic aneurysm repair 2012  . Back surgery   . Insert / replace / remove  pacemaker   . Cataract extraction w/ intraocular lens  implant, bilateral   . Fixation kyphoplasty lumbar spine "March/April2011"  . Coronary artery bypass graft 1996    CABG X5  . Percutaneous endoleak repair 10/19/11    Family History  Problem Relation Age of Onset  . Heart disease Mother   . Heart disease Father   . Heart disease Sister   . Diabetes Daughter   . Stroke Daughter   . Stroke Son     History  Substance Use Topics  . Smoking status: Former Smoker -- 1.0 packs/day for 50 years    Types: Cigarettes, Cigars    Quit date: 11/13/1985  . Smokeless tobacco: Former Neurosurgeon    Types: Chew  . Alcohol Use: Yes     social      Review of Systems ROS reviewed and otherwise negative except for mentioned in HPI  Allergies  Review of patient's allergies indicates no known allergies.  Home Medications   Current Outpatient Rx  Name Route Sig Dispense Refill  . CALCIUM CARBONATE-VITAMIN D 500-200 MG-UNIT PO TABS Oral Take 1 tablet by mouth daily.      Marland Kitchen CARVEDILOL 25 MG PO TABS  Take 1 tab twice a day     . VITAMIN D 1000 UNITS PO TABS Oral Take 1,000 Units by mouth daily.      Marland Kitchen DM-GUAIFENESIN ER 30-600 MG PO TB12 Oral  Take 1 tablet by mouth every 12 (twelve) hours as needed. For congestion    . FINASTERIDE 5 MG PO TABS Oral Take 1 tablet by mouth daily.    Marland Kitchen FLUTICASONE-SALMETEROL 250-50 MCG/DOSE IN AEPB Inhalation Inhale 1 puff into the lungs daily.     . ISOSORBIDE MONONITRATE ER 60 MG PO TB24 Oral Take 90 mg by mouth daily. Takes 1 & 1/2 tab daily     . LOPERAMIDE HCL 2 MG PO TABS Oral Take 2 mg by mouth 4 (four) times daily as needed. For diarrhea     . ONE-DAILY MULTI VITAMINS PO TABS Oral Take 1 tablet by mouth daily.      Marland Kitchen NITROGLYCERIN 0.4 MG SL SUBL Sublingual Place 0.4 mg under the tongue every 5 (five) minutes as needed. For chest pain     . SYSTANE OP Both Eyes Place 1 drop into both eyes 2 (two) times daily.      Marland Kitchen PRAVASTATIN SODIUM 20 MG PO TABS Oral Take  20 mg by mouth daily.      . RESTORA PO Oral Take by mouth daily.     Marland Kitchen RABEPRAZOLE SODIUM 20 MG PO TBEC Oral Take 20 mg by mouth daily.      Marland Kitchen RANITIDINE HCL 150 MG PO TABS Oral Take 150 mg by mouth 2 (two) times daily.      Marland Kitchen VITAMIN C 500 MG PO TABS Oral Take 500 mg by mouth daily.      . WARFARIN SODIUM 5 MG PO TABS Oral Take 2.5-5 mg by mouth as directed. Takes 1/2 tablet on tuesdays and thursdays and 1 tablet on all other days       BP 143/59  Pulse 60  Temp(Src) 97.8 F (36.6 C) (Oral)  Resp 16  SpO2 95% Vitals reviewed Physical Exam Physical Examination: General appearance - alert, well appearing, and in no distress Mental status - alert, oriented to person, place, and time Mouth - mucous membranes moist, pharynx normal without lesions Chest - bilateral expiratory wheezing, breath sounds symmetric, no dyspnea or retractions.   Heart - normal rate, regular rhythm, normal S1, S2, no murmurs, rubs, clicks or gallops Abdomen - soft, nontender, nondistended, no masses or organomegaly Musculoskeletal - no joint tenderness, deformity or swelling Extremities - peripheral pulses normal, no pedal edema, no clubbing or cyanosis Skin - normal coloration and turgor, no rashes, no suspicious skin lesions noted  ED Course  Procedures (including critical care time)   Date: 10/31/2011  Rate: 63  Rhythm: paced rhythm  QRS Axis: left  Intervals: paced rhythm, IVCD  ST/T Wave abnormalities: indeterminate  Conduction Disutrbances:paced rhythm  Narrative Interpretation:   Old EKG Reviewed: no changes compared to ekg of  03/23/11  6:47 PM- pt with chest pain decreased from 10/10 to 4/10 after nitro, nitro drip started.  D/w Dr. Patty Sermons, cardiology, he thinks the pain is a result of the choking episode.  Recommends hospitalist admission.    7:57 PM d/w [facility] hospitalist for admission.  They will see patient in the ED.     Labs Reviewed  CBC - Abnormal; Notable for the following:    RBC  3.40 (*)    Hemoglobin 10.8 (*)    HCT 33.0 (*)    Platelets 140 (*)    All other components within normal limits  BASIC METABOLIC PANEL - Abnormal; Notable for the following:    Glucose, Bld 136 (*)    BUN 30 (*)    GFR calc  non Af Amer 44 (*)    GFR calc Af Amer 52 (*)    All other components within normal limits  PROTIME-INR - Abnormal; Notable for the following:    Prothrombin Time 24.2 (*)    INR 2.13 (*)    All other components within normal limits  APTT - Abnormal; Notable for the following:    aPTT 43 (*)    All other components within normal limits  POCT I-STAT TROPONIN I  TROPONIN I  CK TOTAL AND CKMB  I-STAT TROPONIN I   Dg Chest 2 View  10/31/2011  *RADIOLOGY REPORT*  Clinical Data: Shortness of breath.  Cough.  Wheezing.  CHEST - 2 VIEW  Comparison: 03/30/2011  Findings: Both lungs are clear.  Pulmonary hyperinflation again noted.  A tiny right posterior pleural effusion or thickening is noted.  There is no evidence of pulmonary edema.  Mild cardiomegaly stable.  Dual lead transvenous post anchor remains in appropriate position.  Prior CABG again noted.  IMPRESSION:  Tiny right posterior pleural effusion versus thickening.  Otherwise clear lungs.  Original Report Authenticated By: Danae Orleans, M.D.     1. Chest pain   2. Asthma exacerbation   3. Choking episode       MDM  Patient presenting with chest pain and cough with shortness of breath after episode of choking today. Patient has bilateral expiratory wheezing on exam and also describes his chest pain as similar to her prior MI. He was treated with albuterol steroids and antibiotics for likely COPD/asthma exacerbation. His EKG shows a paced rhythm and is unchanged from prior. His troponin is normal. He was started on nitroglycerin for his chest pain. Cardiology was consulted by telephone and felt that with a normal troponin that he should be observed on the medical service. [facility] has been patient and will admit  him for further management.        Ethelda Chick, MD 11/01/11 [phone]

## 2011-10-31 NOTE — ED Notes (Signed)
Pt requested a diaper, one placed on pt. Family at bedside

## 2011-10-31 NOTE — ED Notes (Signed)
Admitting in with patient consulting

## 2011-10-31 NOTE — ED Notes (Signed)
Pt updated that we were awaiting a room assignment for him

## 2011-10-31 NOTE — ED Notes (Signed)
Pt was sent after choking on food today, nurse at facility states pt has had dysphasia before and wants pt evaluated for aspiration pneumonia. No sob or difficultly swallowing at this time. Pain when he coughed.

## 2011-10-31 NOTE — ED Notes (Signed)
Patient transported to X-ray 

## 2011-10-31 NOTE — ED Notes (Signed)
Pt presents to ed today with no ivs in place.

## 2011-10-31 NOTE — ED Notes (Signed)
Pt back from xray, resp. Paged.

## 2011-10-31 NOTE — H&P (Signed)
PCP:   Ginette Otto, MD, MD  Cardiologist; Dr. Daleen Squibb  Chief Complaint:  Chest pain  HPI: This is a 77y/o gentleman with known history of coronary artery disease and dysphasia who today choked on his meal while eating beef tips and rice. He started coughing quite a bit, which subsequently was followed by severe chest pains. Chest pains was across his torso. Described as dull but greater than 10 out of 10 at its worse. Patient has a history of an MI, he states his chest pain was worse than with his MI. 911 was called he was brought to the ER. He describes the pain as an elephant sitting on his chest. He denies shortness of breath, palpitations or dizziness. In the ER the patient was started on a nitro drip and given morphine he states his chest pain is better 3-4/10. Troponin is negative, patient has a pacemaker. The ER physician discussed the patient with Dr. Charlestine Massed cardiologist covering for Dr. Anner Crete, cardiology aware. History obtained from the patient and his wife who is at the bedside.  Review of Systems: Positives bolded The patient denies anorexia, fever, weight loss,, vision loss, decreased hearing, hoarseness, chest pain, syncope, dyspnea on exertion, peripheral edema, balance deficits, hemoptysis, abdominal pain, melena, hematochezia, severe indigestion/heartburn, hematuria, incontinence, genital sores, muscle weakness, suspicious skin lesions, transient blindness, difficulty walking, depression, unusual weight change, abnormal bleeding, enlarged lymph nodes, angioedema, and breast masses.  Past Medical History: Past Medical History  Diagnosis Date  . CAD (coronary artery disease)     s/p CABG  . Asthma   . PAF (paroxysmal atrial fibrillation)   . Bradycardia   . HTN (hypertension)   . HLD (hyperlipidemia)   . AAA (abdominal aortic aneurysm)   . Orthostatic hypotension   . Dysphagia   . Gastroesophageal reflux disease   . CHF (congestive heart failure)   . Macular  degeneration   . Heart murmur   . Myocardial infarction 1989  . Pneumonia     "3 times 2010"  . Chronic diarrhea    Past Surgical History  Procedure Date  . Pacemaker insertion 2/02    Medtronic Sigma  . Inguinal hernia repair     right  . Abdominal aortic aneurysm repair 2012  . Back surgery   . Insert / replace / remove pacemaker   . Cataract extraction w/ intraocular lens  implant, bilateral   . Fixation kyphoplasty lumbar spine "March/April2011"  . Coronary artery bypass graft 1996    CABG X5  . Percutaneous endoleak repair 10/19/11    Medications: Prior to Admission medications   Medication Sig Start Date End Date Taking? Authorizing Provider  calcium-vitamin D (OSCAL WITH D) 500-200 MG-UNIT per tablet Take 1 tablet by mouth daily.     Yes Historical Provider, MD  carvedilol (COREG) 25 MG tablet Take 1 tab twice a day  08/28/11  Yes Valera Castle, MD  cholecalciferol (VITAMIN D) 1000 UNITS tablet Take 1,000 Units by mouth daily.     Yes Historical Provider, MD  dextromethorphan-guaiFENesin (MUCINEX DM) 30-600 MG per 12 hr tablet Take 1 tablet by mouth every 12 (twelve) hours as needed. For congestion   Yes Historical Provider, MD  finasteride (PROSCAR) 5 MG tablet Take 1 tablet by mouth daily. 01/21/11  Yes Historical Provider, MD  Fluticasone-Salmeterol (ADVAIR DISKUS) 250-50 MCG/DOSE AEPB Inhale 1 puff into the lungs daily.    Yes Historical Provider, MD  isosorbide mononitrate (IMDUR) 60 MG 24 hr tablet Take 90 mg by mouth  daily. Takes 1 & 1/2 tab daily    Yes Historical Provider, MD  loperamide (IMODIUM A-D) 2 MG tablet Take 2 mg by mouth 4 (four) times daily as needed. For diarrhea    Yes Historical Provider, MD  Multiple Vitamin (MULTIVITAMIN) tablet Take 1 tablet by mouth daily.     Yes Historical Provider, MD  nitroGLYCERIN (NITROSTAT) 0.4 MG SL tablet Place 0.4 mg under the tongue every 5 (five) minutes as needed. For chest pain    Yes Historical Provider, MD  Polyethyl  Glycol-Propyl Glycol (SYSTANE OP) Place 1 drop into both eyes 2 (two) times daily.     Yes Historical Provider, MD  pravastatin (PRAVACHOL) 20 MG tablet Take 20 mg by mouth daily.     Yes Historical Provider, MD  Probiotic Product (RESTORA PO) Take by mouth daily.    Yes Historical Provider, MD  RABEprazole (ACIPHEX) 20 MG tablet Take 20 mg by mouth daily.     Yes Historical Provider, MD  ranitidine (ZANTAC) 150 MG tablet Take 150 mg by mouth 2 (two) times daily.     Yes Historical Provider, MD  vitamin C (ASCORBIC ACID) 500 MG tablet Take 500 mg by mouth daily.     Yes Historical Provider, MD  warfarin (COUMADIN) 5 MG tablet Take 2.5-5 mg by mouth as directed. Takes 1/2 tablet on tuesdays and thursdays and 1 tablet on all other days    Yes Historical Provider, MD    Allergies:  No Known Allergies  Social History:  reports that he quit smoking about 25 years ago. His smoking use included Cigarettes and Cigars. He has a 50 pack-year smoking history. He has quit using smokeless tobacco. His smokeless tobacco use included Chew. He reports that he drinks alcohol. He reports that he does not use illicit drugs. not on home oxygen, lives at the [facility] home with his wife, who is at the bedside. Uses cane in the a.m., and Cabler in the night.  Family History: Family History  Problem Relation Age of Onset  . Heart disease Mother   . Heart disease Father   . Heart disease Sister   . Diabetes Daughter   . Stroke Daughter   . Stroke Son     Physical Exam: Filed Vitals:   10/31/11 1642 10/31/11 1711 10/31/11 1834 10/31/11 1948  BP: 148/69 115/64 143/59   Pulse: 61 61 60   Temp:      TempSrc:      Resp: 20 23 16    SpO2: 100% 100% 98% 95%    General:  Alert and oriented times three, well developed and nourished, no acute distress Eyes: PERRLA, pink conjunctiva, no scleral icterus ENT: Moist oral mucosa, neck supple, no thyromegaly Lungs: clear to ascultation, positive mild to moderate wheeze  throughout , no crackles, no use of accessory muscles Cardiovascular: regular rate and rhythm, no regurgitation, no gallops, no murmurs. No carotid bruits, no JVD Abdomen: soft, positive BS, non-tender, non-distended, no organomegaly, not an acute abdomen GU: not examined Neuro: CN II - XII grossly intact, sensation intact Musculoskeletal: strength 5/5 all extremities, no clubbing, cyanosis or edema, mildly reproducible chest wall pain Skin: no rash, no subcutaneous crepitation, no decubitus Psych: appropriate patient   Labs on Admission:   Basename 10/31/11 1550  NA 138  K 4.1  CL 105  CO2 26  GLUCOSE 136*  BUN 30*  CREATININE 1.32  CALCIUM 9.4  MG --  PHOS --   No results found for this basename:  AST:2,ALT:2,ALKPHOS:2,BILITOT:2,PROT:2,ALBUMIN:2 in the last 72 hours No results found for this basename: LIPASE:2,AMYLASE:2 in the last 72 hours  Basename 10/31/11 1550  WBC 7.7  NEUTROABS --  HGB 10.8*  HCT 33.0*  MCV 97.1  PLT 140*   No results found for this basename: CKTOTAL:3,CKMB:3,CKMBINDEX:3,TROPONINI:3 in the last 72 hours No results found for this basename: TSH,T4TOTAL,FREET3,T3FREE,THYROIDAB in the last 72 hours No results found for this basename: VITAMINB12:2,FOLATE:2,FERRITIN:2,TIBC:2,IRON:2,RETICCTPCT:2 in the last 72 hours  Radiological Exams on Admission: Dg Chest 2 View  10/31/2011  *RADIOLOGY REPORT*  Clinical Data: Shortness of breath.  Cough.  Wheezing.  CHEST - 2 VIEW  Comparison: 03/30/2011  Findings: Both lungs are clear.  Pulmonary hyperinflation again noted.  A tiny right posterior pleural effusion or thickening is noted.  There is no evidence of pulmonary edema.  Mild cardiomegaly stable.  Dual lead transvenous post anchor remains in appropriate position.  Prior CABG again noted.  IMPRESSION:  Tiny right posterior pleural effusion versus thickening.  Otherwise clear lungs.  Original Report Authenticated By: Danae Orleans, M.D.   EKG; A. fib/A. Flutter,  no acute ST segment changes   Assessment/Plan Present on Admission:  .Chest pain Coronary artery disease  A. fib/A. flutter  Status post pacemaker placement  Admit patient to step down  Cycle cardiac enzymes  Aspirin ordered, continue nitroglycerin drip  Continue Coumadin pharmacy to manage  I do not see the results of patient's istat troponin on epic, it is 0.4. I will order cardiac enzymes now Dr. Charlestine Massed, cardiology covering for Dr. wall is aware  Dysphagia  We'll keep patient n.p.o. for now,  Bedside swallow ordered  .COPD with acute exacerbation Nebulizers and Solu-Medrol ordered  Tessalon Perles as needed  .HYPERLIPIDEMIA .MACULAR DEGENERATItion .HYPOTENSION, ORTHOSTATIC .BENIGN PROSTATIC HYPERTROPHY, HX OF .GERD Stable, resume home meds   Full code Coumadin for DVT prophylaxis Team 4/Dr. Marica Otter, Avinash Maltos 10/31/2011, 8:30 PM

## 2011-11-01 ENCOUNTER — Encounter (HOSPITAL_COMMUNITY): Payer: Self-pay | Admitting: *Deleted

## 2011-11-01 ENCOUNTER — Other Ambulatory Visit: Payer: Medicare Other

## 2011-11-01 ENCOUNTER — Telehealth: Payer: Self-pay | Admitting: Cardiology

## 2011-11-01 DIAGNOSIS — I369 Nonrheumatic tricuspid valve disorder, unspecified: Secondary | ICD-10-CM

## 2011-11-01 DIAGNOSIS — R079 Chest pain, unspecified: Secondary | ICD-10-CM

## 2011-11-01 LAB — BASIC METABOLIC PANEL
CO2: 26 mEq/L (ref 19–32)
Calcium: 9.8 mg/dL (ref 8.4–10.5)
Creatinine, Ser: 1.05 mg/dL (ref 0.50–1.35)
Glucose, Bld: 156 mg/dL — ABNORMAL HIGH (ref 70–99)

## 2011-11-01 LAB — CBC
MCH: 31.9 pg (ref 26.0–34.0)
MCV: 96.9 fL (ref 78.0–100.0)
Platelets: 130 10*3/uL — ABNORMAL LOW (ref 150–400)
RDW: 14.5 % (ref 11.5–15.5)
WBC: 9.7 10*3/uL (ref 4.0–10.5)

## 2011-11-01 LAB — CARDIAC PANEL(CRET KIN+CKTOT+MB+TROPI)
CK, MB: 3 ng/mL (ref 0.3–4.0)
Total CK: 44 U/L (ref 7–232)
Troponin I: <0.3 ng/mL (ref <0.30)

## 2011-11-01 LAB — PROTIME-INR: Prothrombin Time: 23.7 seconds — ABNORMAL HIGH (ref 11.6–15.2)

## 2011-11-01 MED ORDER — DM-GUAIFENESIN ER 30-600 MG PO TB12
1.0000 | ORAL_TABLET | Freq: Two times a day (BID) | ORAL | Status: DC | PRN
  Filled 2011-11-01: qty 1

## 2011-11-01 MED ORDER — FINASTERIDE 5 MG PO TABS
5.0000 mg | ORAL_TABLET | Freq: Every day | ORAL | Status: DC
  Administered 2011-11-01 – 2011-11-03 (×3): 5 mg via ORAL
  Filled 2011-11-01 (×3): qty 1

## 2011-11-01 MED ORDER — HYDROCODONE-ACETAMINOPHEN 5-325 MG PO TABS: 1.0000 | ORAL_TABLET | ORAL | Status: DC | PRN

## 2011-11-01 MED ORDER — ALUM & MAG HYDROXIDE-SIMETH 200-200-20 MG/5ML PO SUSP: 30.0000 mL | Freq: Four times a day (QID) | ORAL | Status: DC | PRN

## 2011-11-01 MED ORDER — METHYLPREDNISOLONE SODIUM SUCC 40 MG IJ SOLR
40.0000 mg | Freq: Two times a day (BID) | INTRAMUSCULAR | Status: DC
  Administered 2011-11-01 – 2011-11-02 (×3): 40 mg via INTRAVENOUS
  Filled 2011-11-01 (×4): qty 1

## 2011-11-01 MED ORDER — ASPIRIN 325 MG PO TABS
325.0000 mg | ORAL_TABLET | Freq: Every day | ORAL | Status: DC
  Administered 2011-11-01 – 2011-11-03 (×3): 325 mg via ORAL
  Filled 2011-11-01 (×3): qty 1

## 2011-11-01 MED ORDER — BENZONATATE 100 MG PO CAPS
100.0000 mg | ORAL_CAPSULE | Freq: Three times a day (TID) | ORAL | Status: DC | PRN
  Filled 2011-11-01: qty 1

## 2011-11-01 MED ORDER — POTASSIUM CHLORIDE IN NACL 20-0.9 MEQ/L-% IV SOLN
INTRAVENOUS | Status: DC
  Administered 2011-11-01 – 2011-11-03 (×3): via INTRAVENOUS
  Filled 2011-11-01 (×7): qty 1000

## 2011-11-01 MED ORDER — WARFARIN SODIUM 2.5 MG PO TABS
2.5000 mg | ORAL_TABLET | Freq: Once | ORAL | Status: AC
  Administered 2011-11-01: 2.5 mg via ORAL
  Filled 2011-11-01 (×2): qty 1

## 2011-11-01 MED ORDER — ACETAMINOPHEN 325 MG PO TABS: 650.0000 mg | ORAL_TABLET | Freq: Four times a day (QID) | ORAL | Status: DC | PRN

## 2011-11-01 MED ORDER — ALBUTEROL SULFATE (5 MG/ML) 0.5% IN NEBU
2.5000 mg | INHALATION_SOLUTION | RESPIRATORY_TRACT | Status: DC | PRN
  Administered 2011-11-01 – 2011-11-02 (×2): 2.5 mg via RESPIRATORY_TRACT
  Filled 2011-11-01 (×2): qty 0.5

## 2011-11-01 MED ORDER — CARVEDILOL 25 MG PO TABS
25.0000 mg | ORAL_TABLET | Freq: Two times a day (BID) | ORAL | Status: DC
  Administered 2011-11-01 – 2011-11-03 (×4): 25 mg via ORAL
  Filled 2011-11-01 (×5): qty 1

## 2011-11-01 MED ORDER — MORPHINE SULFATE 2 MG/ML IJ SOLN: 1.0000 mg | INTRAMUSCULAR | Status: DC | PRN

## 2011-11-01 MED ORDER — ONDANSETRON HCL 4 MG PO TABS: 4.0000 mg | ORAL_TABLET | Freq: Four times a day (QID) | ORAL | Status: DC | PRN

## 2011-11-01 MED ORDER — SIMVASTATIN 5 MG PO TABS
5.0000 mg | ORAL_TABLET | Freq: Every day | ORAL | Status: DC
  Administered 2011-11-01 – 2011-11-02 (×2): 5 mg via ORAL
  Filled 2011-11-01 (×3): qty 1

## 2011-11-01 MED ORDER — ONDANSETRON HCL 4 MG/2ML IJ SOLN: 4.0000 mg | Freq: Four times a day (QID) | INTRAMUSCULAR | Status: DC | PRN

## 2011-11-01 MED ORDER — PANTOPRAZOLE SODIUM 40 MG PO TBEC
40.0000 mg | DELAYED_RELEASE_TABLET | Freq: Every day | ORAL | Status: DC
  Administered 2011-11-01: 40 mg via ORAL
  Filled 2011-11-01: qty 1

## 2011-11-01 MED ORDER — IPRATROPIUM BROMIDE 0.02 % IN SOLN
0.5000 mg | RESPIRATORY_TRACT | Status: DC | PRN
  Administered 2011-11-01 – 2011-11-02 (×2): 0.5 mg via RESPIRATORY_TRACT
  Filled 2011-11-01 (×2): qty 2.5

## 2011-11-01 MED ORDER — ACETAMINOPHEN 650 MG RE SUPP: 650.0000 mg | Freq: Four times a day (QID) | RECTAL | Status: DC | PRN

## 2011-11-01 MED ORDER — PANTOPRAZOLE SODIUM 40 MG PO TBEC
40.0000 mg | DELAYED_RELEASE_TABLET | Freq: Two times a day (BID) | ORAL | Status: DC
  Administered 2011-11-02 – 2011-11-03 (×3): 40 mg via ORAL
  Filled 2011-11-01 (×5): qty 1

## 2011-11-01 MED ORDER — NITROGLYCERIN IN D5W 200-5 MCG/ML-% IV SOLN: 2.0000 ug/min | INTRAVENOUS | Status: DC

## 2011-11-01 NOTE — Consult Note (Signed)
CARDIOLOGY CONSULT NOTE    Patient ID: Clayton Gordon MRN: [id number] DOB/AGE: October 07, 1915 77 y.o.  Admit date: 10/31/2011 Referring Physician Gwenlyn Perking Primary Physician Primary Cardiologist Valera Castle MD Reason for Consultation Chest pain  HPI: 77 yo white male with known history of CAD presented to the ED yesterday for complaint of chest pain. Pain was doing well until while eating lunch he choked on some food. This resulted in severe coughing that lasting one hour. He thinks he finally coughed up the matter he choked on. With coughing he developed severe chest pain that he rated at 10/10. He described this as a pressure or sensation of a heavy weight on his chest. It radiated to his left arm. Pain relieved partially with IV morphine x 1. Today still complains of constant 4/10 chest pain. He has received no other analgesic. Is on low dose IV Ntg. Has continued to cough some since yesterday.  Patient is s/p CABG x4 about 14 years ago at [facility], [place]. 5 years later he reports he had a cardiac cath that showed one graft was occluded. He has a hx of chronic Afib, s/p PPM, chronic coumadin Rx. He has a prior stent graft for AAA and recently had a coiling procedure for an endoleak.    Review of systems complete and found to be negative unless listed above   Past Medical History  Diagnosis Date  . CAD (coronary artery disease)     s/p CABG  . Asthma   . PAF (paroxysmal atrial fibrillation)   . Bradycardia   . HTN (hypertension)   . HLD (hyperlipidemia)   . AAA (abdominal aortic aneurysm)   . Orthostatic hypotension   . Dysphagia   . Gastroesophageal reflux disease   . CHF (congestive heart failure)   . Macular degeneration   . Heart murmur   . Myocardial infarction 1989  . Pneumonia     "3 times 2010"  . Chronic diarrhea     Family History  Problem Relation Age of Onset  . Heart disease Mother   . Heart disease Father   . Heart disease Sister   . Diabetes Daughter   . Stroke  Daughter   . Stroke Son     History   Social History  . Marital Status: Married    Spouse Name: N/A    Number of Children: N/A  . Years of Education: N/A   Occupational History  . Not on file.   Social History Main Topics  . Smoking status: Former Smoker -- 1.0 packs/day for 50 years    Types: Cigarettes, Cigars    Quit date: 11/13/1985  . Smokeless tobacco: Former Neurosurgeon    Types: Chew  . Alcohol Use: Yes     social  . Drug Use: No  . Sexually Active: Yes   Other Topics Concern  . Not on file   Social History Narrative  . No narrative on file    Past Surgical History  Procedure Date  . Pacemaker insertion 2/02    Medtronic Sigma  . Inguinal hernia repair     right  . Abdominal aortic aneurysm repair 2012  . Back surgery   . Insert / replace / remove pacemaker   . Cataract extraction w/ intraocular lens  implant, bilateral   . Fixation kyphoplasty lumbar spine "March/April2011"  . Coronary artery bypass graft 1996    CABG X5  . Percutaneous endoleak repair 10/19/11      (Not in a hospital admission)  Physical Exam: Blood pressure 146/67, pulse 63, temperature 98 F (36.7 C), temperature source Oral, resp. rate 18, SpO2 100.00%.   Elderly white male in NAD. HEENT [place]/AT, PERRLA, arcus senilis. Wears hearing aids. Oropharynx is clear. No JVD, bruits, adenopathy, or thyromegaly. Lungs reveal bilateral course rhonchi and wheezes. CV IRRR with 2-3/6 harsh systolic murmur RUSB radiating to apex. Chest wall without focal tenderness. Abdomen soft without masses or tenderness. No HSM Ext without cyanosis, edema. Pedal pulses are good. Neuro alert and oriented x 3. CN II-XII are intact.  Skin is warm and dry. Labs:   Lab Results  Component Value Date   WBC 9.7 11/01/2011   HGB 11.3* 11/01/2011   HCT 34.3* 11/01/2011   MCV 96.9 11/01/2011   PLT 130* 11/01/2011    Lab 11/01/11 0420  NA 138  K 4.1  CL 105  CO2 26  BUN 28*  CREATININE 1.05  CALCIUM 9.8    PROT --  BILITOT --  ALKPHOS --  ALT --  AST --  GLUCOSE 156*   Lab Results  Component Value Date   CKTOTAL 51 10/31/2011   CKMB 3.6 10/31/2011   TROPONINI <0.30 11/01/2011      Radiology: CXR shows hyperinflation with right pleural thickening.  EKG: Afib with V pacing,rate 60.  ASSESSMENT AND PLAN:   1. Chest pain. I think pain is related to strain of severe coughing/choking episode. Given negative serial cardiac enzymes despite ongoing chest pain it is unlikely that this is cardiac.  2. History of CAD, s/p CABG 3. Chronic Afib 4. S/p PPM 5. Moderate AS by Echo 2010. 6. Copd exacerbation 7. S/p endograft AAA repair with recent coil procedure for endograft leak. 8. Htn 9. Hyperlipidemia.  Plan: Recommend analgesics for pain. Pulmonary toilet. Agree with swallowing evaluation. Follow up on Echo done today. We will follow up in the morning but I don't think further cardiac evaluation is warranted.  Signed: Birney Belshe [place] MD, [facility] 11/01/2011, 5:07 PM

## 2011-11-01 NOTE — Progress Notes (Signed)
Subjective: Despite nitroglycerin drip patient is still having 4/10 chest pain. Reports improvement in his shortness of breath and is currently no having any fever, no nausea, no vomiting or abdominal pain.  Objective: Vital signs in last 24 hours: Temp:  [98 F (36.7 C)] 98 F (36.7 C) (12/19 0620) Pulse Rate:  [60-79] 63  (12/19 1248) Resp:  [14-23] 18  (12/19 1248) BP: (115-148)/(59-69) 146/67 mmHg (12/19 1248) SpO2:  [95 %-100 %] 100 % (12/19 1248) Weight change:     Intake/Output from previous day: 12/18 0701 - 12/19 0700 In: 1.5 [I.V.:1.5] Out: -  Total I/O In: -  Out: 300 [Urine:300]   Physical Exam: General: Alert, awake, oriented x2, in no acute distress. HEENT: No bruits, no goiter. Heart: irregular, positive systolic murmur, no rubs or gallops. Lungs: Scattered wheezing, no crackles, fair air movement bilaterally. Abdomen: Soft, nontender, nondistended, positive bowel sounds. Extremities: No clubbing, cyanosis or edema. Full range of motion Neuro: Grossly intact, nonfocal.  Lab Results: Basic Metabolic Panel:  Basename 11/01/11 0420 10/31/11 1550  NA 138 138  K 4.1 4.1  CL 105 105  CO2 26 26  GLUCOSE 156* 136*  BUN 28* 30*  CREATININE 1.05 1.32  CALCIUM 9.8 9.4  MG -- --  PHOS -- --   CBC:  Basename 11/01/11 0420 10/31/11 1550  WBC 9.7 7.7  NEUTROABS -- --  HGB 11.3* 10.8*  HCT 34.3* 33.0*  MCV 96.9 97.1  PLT 130* 140*   Cardiac Enzymes:  Basename 11/01/11 1100 10/31/11 2127  CKTOTAL -- 51  CKMB -- 3.6  CKMBINDEX -- --  TROPONINI <0.30 <0.30   Coagulation:  Basename 11/01/11 1100 10/31/11 1550  LABPROT 23.7* 24.2*  INR 2.07* 2.13*    Studies/Results: Dg Chest 2 View  10/31/2011  *RADIOLOGY REPORT*  Clinical Data: Shortness of breath.  Cough.  Wheezing.  CHEST - 2 VIEW  Comparison: 03/30/2011  Findings: Both lungs are clear.  Pulmonary hyperinflation again noted.  A tiny right posterior pleural effusion or thickening is noted.   There is no evidence of pulmonary edema.  Mild cardiomegaly stable.  Dual lead transvenous post anchor remains in appropriate position.  Prior CABG again noted.  IMPRESSION:  Tiny right posterior pleural effusion versus thickening.  Otherwise clear lungs.  Original Report Authenticated By: Danae Orleans, M.D.    Medications: Scheduled Meds:   . albuterol  5 mg Nebulization Once  . albuterol  5 mg Nebulization Once  . aspirin  325 mg Oral Once  . aspirin  325 mg Oral Daily  . carvedilol  25 mg Oral BID WC  . finasteride  5 mg Oral Daily  . ipratropium  0.5 mg Nebulization Once  . methylPREDNISolone (SOLU-MEDROL) injection  125 mg Intravenous Once  . methylPREDNISolone (SOLU-MEDROL) injection  40 mg Intravenous Q12H  .  morphine injection  4 mg Intravenous Once  . moxifloxacin  400 mg Intravenous Once  . nitroGLYCERIN  2-200 mcg/min Intravenous Once  . pantoprazole  40 mg Oral BID AC  . simvastatin  5 mg Oral q1800  . warfarin  2.5 mg Oral ONCE-1800  . DISCONTD: pantoprazole  40 mg Oral Q1200   Continuous Infusions:   . 0.9 % NaCl with KCl 20 mEq / L     PRN Meds:.acetaminophen, acetaminophen, alum & mag hydroxide-simeth, HYDROcodone-acetaminophen, morphine, nitroGLYCERIN, ondansetron (ZOFRAN) IV, ondansetron  Assessment/Plan: 1-Chest pain: Despite the use of nitroglycerin drip patient continued to have left-sided chest pain radiating to his arm; but  he endorses that the pain has improved. Cardiology has been consulted but they have not seen the patient yet. Cardiac enzymes showing negative troponins x 3; there are 3 full-cardiac enzymes sets that has been order and at this moment pending. 2-D echo order but not done yet. At this point will continue nitroglycerin, statins, aspirin, carvedilol and morphine. Will follow cardiology recommendations. Due to the need of step-down for nitroglycerin drip patient still in the ER.  2-COPD with acute exacerbation: Continue supplemental, continue  nebulizers and Avelox. At this point patient breathing has improved and is no longer wheezing as bad as he was at the moment of admission.  3-HYPERLIPIDEMIA: Continue statins.  4-CORONARY ARTERY DISEASE: Continue cycling cardiac enzymes, continue beta blocker, continue aspirin and statins. 2-D echo has been ordered. Cardiology has been consulted.   5-Atrial fibrillation: Currently rate controlled; continue carvedilol and warfarin per pharmacy.   6-GERD: Continue PPI.  7-Other dysphagia: Speech therapy has been consulted and at this point they have recommended regular diet and thin liquids.  8-BENIGN PROSTATIC HYPERTROPHY, HX OF: Continue finasteride  9-DVT: Patient on Coumadin.    LOS: 1 day   Dameon Soltis [facility] [phone]  11/01/2011, 3:52 PM

## 2011-11-01 NOTE — ED Notes (Signed)
Patient is resting comfortably. 

## 2011-11-01 NOTE — ED Notes (Signed)
Speech therapy was called for speech eval. Meds are not able to be given until this is completed. Waiting for their call back.

## 2011-11-01 NOTE — ED Notes (Signed)
Placed call to Dr. Gwenlyn Perking. Pt can have heart healthy diet if he passes his swallow eval.

## 2011-11-01 NOTE — Telephone Encounter (Signed)
New msg Pt's daughter is calling She wants to talk to you about her father's care while he is in the hospital. She is [place] of [place] Please call her back

## 2011-11-01 NOTE — Progress Notes (Signed)
Clinical/Bedside Swallow Evaluation Patient Details  Name: Clayton Gordon MRN: [id number] DOB: 1915-07-19 Today's Date: 11/01/2011 [id number]  Past Medical History:  Past Medical History  Diagnosis Date  . CAD (coronary artery disease)     s/p CABG  . Asthma   . PAF (paroxysmal atrial fibrillation)   . Bradycardia   . HTN (hypertension)   . HLD (hyperlipidemia)   . AAA (abdominal aortic aneurysm)   . Orthostatic hypotension   . Dysphagia   . Gastroesophageal reflux disease   . CHF (congestive heart failure)   . Macular degeneration   . Heart murmur   . Myocardial infarction 1989  . Pneumonia     "3 times 2010"  . Chronic diarrhea    Past Surgical History:  Past Surgical History  Procedure Date  . Pacemaker insertion 2/02    Medtronic Sigma  . Inguinal hernia repair     right  . Abdominal aortic aneurysm repair 2012  . Back surgery   . Insert / replace / remove pacemaker   . Cataract extraction w/ intraocular lens  implant, bilateral   . Fixation kyphoplasty lumbar spine "March/April2011"  . Coronary artery bypass graft 1996    CABG X5  . Percutaneous endoleak repair 10/19/11    Assessment/Recommendations/Treatment Plan Suspected Esophageal Findings Suspected Esophageal Findings: Other (comment) (known h/o GERD on Aciphex, Ranitidine, pt denies s/s reflux)  SLP Assessment Clinical Impression Statement: Pt with known dysphagia x years per his report, suspect isolated severe aspiration event with beef/rice.  Pt appeared to tolerate full sandwich, 1/2 cheese bolus and 3 ounces water before coughing initiated.  Swallow appears timely with clear voice.  Pt acknowledges incr wheeze/cough routinely half way through meals.  Advised pt to consume several small meals/day and monitor respiratory status taking breaks if dyspenic or once coughing initiates.   Given pt with lack of pulmonary infections since 2010 and weight maintenance, suspect tolerance of episodic aspiration.   Pt's  mobility status, ability to self feed maximizes airway protection.  Do not recommend MBS due to chronicity of pt's dysphagia and isolated event with pt self-management.  If pt presents with recurrent pulmonary infections, weight loss or worsening swallow function, he may benefit from repeat MBS.  Pt and spouse agreed.   SLP phoned ALF RN, Marylene Land, who was called to pt's place with choking episode.  RN reports choking incident was isolated as far as she knows and pt consumes regular diet.    Pt stated if he is careful and chews thoroughly, he tolerates intake well with less coughing.  Suspect episodic aspiration with chronic dysphagia.  Advised pt to suspicions and he and spouse verbalized understanding.  Rec spouse learn Heimlich manuever for use prn.   Risk for Aspiration: Moderate Other Related Risk Factors: History of pneumonia;History of dysphagia;History of GERD;Decreased respiratory status;History of esophageal-related issues  Recommendations Solid Consistency: Dysphagia 3 (Mechanical soft)Avoid particulate food and mixed consistencies Liquid Consistency: Thin (water with meals) Liquid Administration via: Cup;Straw Medication Administration: Whole meds with puree (follow with water) Supervision: Patient able to self feed Compensations: Slow rate;Small sips/bites (stop eating if coughing or dyspneic) Postural Changes and/or Swallow Maneuvers: Upright 30-60 min after meal;Seated upright 90 degrees;Out of bed for meals Oral Care Recommendations: Oral care QID Follow up Recommendations: None   Treatment Plan Treatment Plan Recommendations: Therapy as outlined in treatment plan below Speech Therapy Frequency: min 1 x/week Treatment Duration: 1 week Interventions: Aspiration precaution training;Patient/family education;Compensatory techniques;Diet toleration management by SLP  Prognosis Prognosis  for Safe Diet Advancement: Good Barriers to Reach Goals: Time post onset Barriers/Prognosis  Comment: h/o dysphagia   Individuals Consulted Consulted and Agree with Results and Recommendations: Patient;Family member/caregiver;RN Family Member Consulted: wife, family member  Swallowing Goals  SLP Swallowing Goals Patient will utilize recommended strategies during swallow to increase swallowing safety with: Independent assistance  Swallow Study Prior Functional Status   fed self, regular/thin.  General   see above.  Oral Motor/Sensory Function  Labial ROM: Within Functional Limits Labial Symmetry: Within Functional Limits Labial Strength: Within Functional Limits Labial Sensation: Within Functional Limits Lingual ROM: Within Functional Limits Lingual Symmetry: Within Functional Limits Lingual Strength: Within Functional Limits Lingual Sensation: Within Functional Limits Facial ROM: Within Functional Limits Facial Symmetry: Within Functional Limits Facial Strength: Within Functional Limits Facial Sensation: Within Functional Limits Velum: Within Functional Limits Mandible: Within Functional Limits  Consistency Results  Ice Chips Ice chips: Not tested  Thin Liquid Thin Liquid: Within functional limits Presentation: Straw;Cup;Self Fed Other Comments: delayed coughing approximately 3/4 way through snack  Nectar Thick Liquid Nectar Thick Liquid: Not tested  Honey Thick Liquid Honey Thick Liquid: Not tested  Puree Puree: Not tested  Solid Solid: Within functional limits Presentation: Self Fed Other Comments: delayed coughing approximately 3/4 into snack, pt reports this to occur x several years   Chales Abrahams 11/01/2011,1:42 PM

## 2011-11-01 NOTE — Progress Notes (Signed)
ANTICOAGULATION CONSULT NOTE - Initial Consult  Pharmacy Consult for Warfarin Indication: atrial fibrillation  No Known Allergies   Vital Signs: Temp: 98 F (36.7 C) (12/19 0620) Temp src: Oral (12/19 0620) BP: 135/60 mmHg (12/19 0620) Pulse Rate: 79  (12/19 0620)  Labs:  Basename 11/01/11 1100 11/01/11 0420 10/31/11 2127 10/31/11 1550  HGB -- 11.3* -- 10.8*  HCT -- 34.3* -- 33.0*  PLT -- 130* -- 140*  APTT -- -- -- 43*  LABPROT 23.7* -- -- 24.2*  INR 2.07* -- -- 2.13*  HEPARINUNFRC -- -- -- --  CREATININE -- 1.05 -- 1.32  CKTOTAL -- -- 51 --  CKMB -- -- 3.6 --  TROPONINI -- -- <0.30 --   The CrCl is unknown because both a height and weight (above a minimum accepted value) are required for this calculation.  Medical History: Past Medical History  Diagnosis Date  . CAD (coronary artery disease)     s/p CABG  . Asthma   . PAF (paroxysmal atrial fibrillation)   . Bradycardia   . HTN (hypertension)   . HLD (hyperlipidemia)   . AAA (abdominal aortic aneurysm)   . Orthostatic hypotension   . Dysphagia   . Gastroesophageal reflux disease   . CHF (congestive heart failure)   . Macular degeneration   . Heart murmur   . Myocardial infarction 1989  . Pneumonia     "3 times 2010"  . Chronic diarrhea     Medications:  Scheduled:    . albuterol  5 mg Nebulization Once  . albuterol  5 mg Nebulization Once  . aspirin  325 mg Oral Once  . aspirin  325 mg Oral Daily  . carvedilol  25 mg Oral BID WC  . finasteride  5 mg Oral Daily  . ipratropium  0.5 mg Nebulization Once  . methylPREDNISolone (SOLU-MEDROL) injection  125 mg Intravenous Once  . methylPREDNISolone (SOLU-MEDROL) injection  40 mg Intravenous Q12H  .  morphine injection  4 mg Intravenous Once  . moxifloxacin  400 mg Intravenous Once  . nitroGLYCERIN  2-200 mcg/min Intravenous Once  . pantoprazole  40 mg Oral Q1200  . simvastatin  5 mg Oral q1800    Assessment: 77 yo male with hx of Afib on chronic  Coumadin. Home dose 2.5mg  on Tu,Th & 5mg  other days of the week Admit with chest pain/choked on meal. Hx of MI/CAD/PAF Goal of Therapy:  INR 2-3   Plan:  Coumadin 2.5mg  today, Daily protimes.   Versie Starks D pager [phone] 11/01/2011,11:46 AM

## 2011-11-01 NOTE — Progress Notes (Signed)
  Echocardiogram 2D Echocardiogram has been performed.  Clayton Gordon [facility] 11/01/2011, 4:34 PM

## 2011-11-01 NOTE — ED Notes (Signed)
Speech therapy at bedside.

## 2011-11-02 ENCOUNTER — Encounter: Payer: Medicare Other | Admitting: *Deleted

## 2011-11-02 LAB — PROTIME-INR
INR: 2.15 — ABNORMAL HIGH (ref 0.00–1.49)
Prothrombin Time: 24.4 seconds — ABNORMAL HIGH (ref 11.6–15.2)

## 2011-11-02 LAB — CBC
HCT: 32.9 % — ABNORMAL LOW (ref 39.0–52.0)
Hemoglobin: 10.9 g/dL — ABNORMAL LOW (ref 13.0–17.0)
MCHC: 33.1 g/dL (ref 30.0–36.0)
RBC: 3.39 MIL/uL — ABNORMAL LOW (ref 4.22–5.81)

## 2011-11-02 LAB — CARDIAC PANEL(CRET KIN+CKTOT+MB+TROPI)
CK, MB: 3 ng/mL (ref 0.3–4.0)
CK, MB: 3.1 ng/mL (ref 0.3–4.0)
Relative Index: INVALID (ref 0.0–2.5)
Total CK: 42 U/L (ref 7–232)
Total CK: 49 U/L (ref 7–232)
Troponin I: <0.3 ng/mL (ref <0.30)

## 2011-11-02 LAB — BASIC METABOLIC PANEL
BUN: 31 mg/dL — ABNORMAL HIGH (ref 6–23)
CO2: 25 mEq/L (ref 19–32)
Chloride: 108 mEq/L (ref 96–112)
GFR calc non Af Amer: 68 mL/min — ABNORMAL LOW (ref >90)
Glucose, Bld: 174 mg/dL — ABNORMAL HIGH (ref 70–99)
Potassium: 4.4 mEq/L (ref 3.5–5.1)
Sodium: 139 mEq/L (ref 135–145)

## 2011-11-02 LAB — LIPID PANEL
LDL Cholesterol: 71 mg/dL (ref 0–99)
VLDL: 10 mg/dL (ref 0–40)

## 2011-11-02 MED ORDER — PREDNISONE 50 MG PO TABS
80.0000 mg | ORAL_TABLET | Freq: Every day | ORAL | Status: DC
  Administered 2011-11-02 – 2011-11-03 (×2): 80 mg via ORAL
  Filled 2011-11-02 (×3): qty 1

## 2011-11-02 MED ORDER — POLYETHYL GLYCOL-PROPYL GLYCOL 0.4-0.3 % OP SOLN: 1.0000 [drp] | Freq: Two times a day (BID) | OPHTHALMIC | Status: DC

## 2011-11-02 MED ORDER — POLYVINYL ALCOHOL 1.4 % OP SOLN
1.0000 [drp] | Freq: Two times a day (BID) | OPHTHALMIC | Status: DC
  Administered 2011-11-02 – 2011-11-03 (×2): 1 [drp] via OPHTHALMIC
  Filled 2011-11-02: qty 15

## 2011-11-02 MED ORDER — IPRATROPIUM-ALBUTEROL 18-103 MCG/ACT IN AERO
2.0000 | INHALATION_SPRAY | Freq: Three times a day (TID) | RESPIRATORY_TRACT | Status: DC
  Administered 2011-11-03: 2 via RESPIRATORY_TRACT
  Filled 2011-11-02: qty 14.7

## 2011-11-02 MED ORDER — WARFARIN SODIUM 2.5 MG PO TABS
2.5000 mg | ORAL_TABLET | Freq: Once | ORAL | Status: AC
  Administered 2011-11-02: 2.5 mg via ORAL
  Filled 2011-11-02: qty 1

## 2011-11-02 MED ORDER — PANTOPRAZOLE SODIUM 40 MG PO TBEC
40.0000 mg | DELAYED_RELEASE_TABLET | Freq: Two times a day (BID) | ORAL | Status: DC
  Filled 2011-11-02 (×3): qty 1

## 2011-11-02 MED ORDER — ISOSORBIDE MONONITRATE ER 60 MG PO TB24
90.0000 mg | ORAL_TABLET | Freq: Every day | ORAL | Status: DC
  Administered 2011-11-02 – 2011-11-03 (×2): 90 mg via ORAL
  Filled 2011-11-02 (×2): qty 1

## 2011-11-02 MED ORDER — MOXIFLOXACIN HCL 400 MG PO TABS
400.0000 mg | ORAL_TABLET | Freq: Every day | ORAL | Status: DC
  Administered 2011-11-02: 400 mg via ORAL
  Filled 2011-11-02 (×2): qty 1

## 2011-11-02 MED ORDER — FLUTICASONE-SALMETEROL 250-50 MCG/DOSE IN AEPB
1.0000 | INHALATION_SPRAY | Freq: Every day | RESPIRATORY_TRACT | Status: DC
  Administered 2011-11-02: 1 via RESPIRATORY_TRACT
  Filled 2011-11-02: qty 14

## 2011-11-02 NOTE — Progress Notes (Signed)
SUBJECTIVE:  Still with some chest pain with inspiration      PHYSICAL EXAM Filed Vitals:   11/01/11 2227 11/02/11 0000 11/02/11 0400 11/02/11 0600  BP:  135/56  138/72  Pulse:  61    Temp:  98.1 F (36.7 C) 98.3 F (36.8 C)   TempSrc:  Oral Oral   Resp:  15    Height:      Weight:  64.2 kg (141 lb 8.6 oz)    SpO2: 100% 96%     General:  No distress Lungs:  Course crackles Heart:  RRR Abdomen:  Positive bowel sounds, no rebound no guarding Extremities:  No edema.  LABS: Lab Results  Component Value Date   CKTOTAL 42 11/02/2011   CKMB 3.1 11/02/2011   TROPONINI <0.30 11/02/2011   Results for orders placed during the hospital encounter of 10/31/11 (from the past 24 hour(s))  TROPONIN I     Status: Normal   Collection Time   11/01/11 11:00 AM      Component Value Range   Troponin I <0.30  <0.30 (ng/mL)  PROTIME-INR     Status: Abnormal   Collection Time   11/01/11 11:00 AM      Component Value Range   Prothrombin Time 23.7 (*) 11.6 - 15.2 (seconds)   INR 2.07 (*) 0.00 - 1.49   CARDIAC PANEL(CRET KIN+CKTOT+MB+TROPI)     Status: Normal   Collection Time   11/01/11  4:35 PM      Component Value Range   Total CK 44  7 - 232 (U/L)   CK, MB 3.0  0.3 - 4.0 (ng/mL)   Troponin I <0.30  <0.30 (ng/mL)   Relative Index RELATIVE INDEX IS INVALID  0.0 - 2.5   TROPONIN I     Status: Normal   Collection Time   11/01/11  6:30 PM      Component Value Range   Troponin I <0.30  <0.30 (ng/mL)  MRSA PCR SCREENING     Status: Normal   Collection Time   11/01/11  6:34 PM      Component Value Range   MRSA by PCR NEGATIVE  NEGATIVE   CARDIAC PANEL(CRET KIN+CKTOT+MB+TROPI)     Status: Normal   Collection Time   11/02/11 12:05 AM      Component Value Range   Total CK 42  7 - 232 (U/L)   CK, MB 3.1  0.3 - 4.0 (ng/mL)   Troponin I <0.30  <0.30 (ng/mL)   Relative Index RELATIVE INDEX IS INVALID  0.0 - 2.5   PROTIME-INR     Status: Abnormal   Collection Time   11/02/11  1:00 AM       Component Value Range   Prothrombin Time 24.4 (*) 11.6 - 15.2 (seconds)   INR 2.15 (*) 0.00 - 1.49   CBC     Status: Abnormal   Collection Time   11/02/11  1:00 AM      Component Value Range   WBC 10.5  4.0 - 10.5 (K/uL)   RBC 3.39 (*) 4.22 - 5.81 (MIL/uL)   Hemoglobin 10.9 (*) 13.0 - 17.0 (g/dL)   HCT 16.1 (*) 09.6 - 52.0 (%)   MCV 97.1  78.0 - 100.0 (fL)   MCH 32.2  26.0 - 34.0 (pg)   MCHC 33.1  30.0 - 36.0 (g/dL)   RDW 04.5  40.9 - 81.1 (%)   Platelets 128 (*) 150 - 400 (K/uL)  BASIC METABOLIC PANEL  Status: Abnormal   Collection Time   11/02/11  1:00 AM      Component Value Range   Sodium 139  135 - 145 (mEq/L)   Potassium 4.4  3.5 - 5.1 (mEq/L)   Chloride 108  96 - 112 (mEq/L)   CO2 25  19 - 32 (mEq/L)   Glucose, Bld 174 (*) 70 - 99 (mg/dL)   BUN 31 (*) 6 - 23 (mg/dL)   Creatinine, Ser 4.69  0.50 - 1.35 (mg/dL)   Calcium 9.6  8.4 - 62.9 (mg/dL)   GFR calc non Af Amer 68 (*) >90 (mL/min)   GFR calc Af Amer 79 (*) >90 (mL/min)    Intake/Output Summary (Last 24 hours) at 11/02/11 0737 Last data filed at 11/02/11 0600  Gross per 24 hour  Intake  919.5 ml  Output    475 ml  Net  444.5 ml     ASSESSMENT AND PLAN:  1)  Chest pain:    Still with some atypical pain.  However, no objective evidence of ischemia.  Echo still not read and I will follow up on this.  However, we don't suspect that he will need any further cardiovascular testing in hospital.  Discontinue the IV NTG.  2)  Atrial fibrillation:  Warfarin per pharmacy.  Pacer is followed by Dr. Ladona Ridgel. Rollene Rotunda 11/02/2011 7:37 AM

## 2011-11-02 NOTE — Progress Notes (Signed)
Speech Language/Pathology Speech Pathology: Dysphagia Treatment Note  [phone]  Patient was observed with : thin water...delayed cough x1 observed, pt also with cough at baseline currently secondary to medical condition.  Spouse and pt deny pt coughing during breakfast except at end of meal.  Re-educated pt and spouse to aspiration precautions given pt's COPD.   Pt and spouse verbalized they had reviewed paperwork SLP provided yesterday and did not have questions.  Pt also stated he is no longer tucking his chin (as he was instructed by a medical staff person previously), as chin tuck clearly worsened swallow on previous MBS study.  Pt reports improved swallow when not tucking chin.     Lung Sounds:  Decreased, wheeze Temperature: afebrile  Patient required: no cues to consistently follow precautions/strategies- especially taking small bites/sips  Clinical Impression: Swallow function at baseline per pt and spouse, episodic aspiration risk will remain constant secondary to pt's COPD and known dysphagia x years.  Pt and spouse have been fully educated to precautions to maximize airway protection.    Recommendations:  No further follow up required as all education complete.   Pain:   none Intervention Required:   No  Goals: All Goals Met  Dollene Cleveland MS [facility] SLP [phone]

## 2011-11-02 NOTE — Progress Notes (Signed)
Subjective: No CP, no N/V; no fever and no SOB.  Objective: Vital signs in last 24 hours: Temp:  [97.5 F (36.4 C)-98.3 F (36.8 C)] 98.3 F (36.8 C) (12/20 0400) Pulse Rate:  [61-81] 61  (12/20 0000) Resp:  [15-24] 15  (12/20 0000) BP: (109-146)/(56-85) 138/72 mmHg (12/20 0600) SpO2:  [96 %-100 %] 96 % (12/20 0000) Weight:  [62.6 kg (138 lb 0.1 oz)-64.2 kg (141 lb 8.6 oz)] 141 lb 8.6 oz (64.2 kg) (12/20 0000) Weight change:  Last BM Date: 11/01/11  Intake/Output from previous day: 12/19 0701 - 12/20 0700 In: 919.5 [I.V.:919.5] Out: 475 [Urine:475]     Physical Exam: General: Alert, awake, oriented x2, in no acute distress. HEENT: No bruits, no goiter. Heart: irregular, positive systolic murmur, no rubs or gallops. Lungs: Scattered wheezing, no crackles, fair air movement bilaterally. Abdomen: Soft, nontender, nondistended, positive bowel sounds. Extremities: No clubbing, cyanosis or edema. Full range of motion Neuro: Grossly intact, nonfocal.  Lab Results: Basic Metabolic Panel:  Basename 11/02/11 0100 11/01/11 0420  NA 139 138  K 4.4 4.1  CL 108 105  CO2 25 26  GLUCOSE 174* 156*  BUN 31* 28*  CREATININE 0.97 1.05  CALCIUM 9.6 9.8  MG -- --  PHOS -- --   CBC:  Basename 11/02/11 0100 11/01/11 0420  WBC 10.5 9.7  NEUTROABS -- --  HGB 10.9* 11.3*  HCT 32.9* 34.3*  MCV 97.1 96.9  PLT 128* 130*   Cardiac Enzymes:  Basename 11/02/11 0005 11/01/11 1830 11/01/11 1635 10/31/11 2127  CKTOTAL 42 -- 44 51  CKMB 3.1 -- 3.0 3.6  CKMBINDEX -- -- -- --  TROPONINI <0.30 <0.30 <0.30 --   Coagulation:  Basename 11/02/11 0100 11/01/11 1100  LABPROT 24.4* 23.7*  INR 2.15* 2.07*    Studies/Results: Dg Chest 2 View  10/31/2011  *RADIOLOGY REPORT*  Clinical Data: Shortness of breath.  Cough.  Wheezing.  CHEST - 2 VIEW  Comparison: 03/30/2011  Findings: Both lungs are clear.  Pulmonary hyperinflation again noted.  A tiny right posterior pleural effusion or  thickening is noted.  There is no evidence of pulmonary edema.  Mild cardiomegaly stable.  Dual lead transvenous post anchor remains in appropriate position.  Prior CABG again noted.  IMPRESSION:  Tiny right posterior pleural effusion versus thickening.  Otherwise clear lungs.  Original Report Authenticated By: Danae Orleans, M.D.    Medications: Scheduled Meds:    . aspirin  325 mg Oral Daily  . carvedilol  25 mg Oral BID WC  . finasteride  5 mg Oral Daily  . methylPREDNISolone (SOLU-MEDROL) injection  40 mg Intravenous Q12H  . pantoprazole  40 mg Oral BID AC  . simvastatin  5 mg Oral q1800  . warfarin  2.5 mg Oral ONCE-1800  . DISCONTD: pantoprazole  40 mg Oral Q1200   Continuous Infusions:    . 0.9 % NaCl with KCl 20 mEq / L 75 mL/hr at 11/01/11 2202  . DISCONTD: nitroGLYCERIN     PRN Meds:.acetaminophen, acetaminophen, albuterol, alum & mag hydroxide-simeth, benzonatate, dextromethorphan-guaiFENesin, HYDROcodone-acetaminophen, ipratropium, morphine, ondansetron (ZOFRAN) IV, ondansetron  Assessment/Plan: 1-Chest pain: cardiac enzymes negative; 2-D echo pending but done already. Now CP free w/o nitroglycerin. Will continue treatment for his COPD exacerbation; transfer out of step-down unit and most likely home tomorrow.  2-COPD with acute exacerbation: start tapering prednisone and po antibiotics; will continue pulmonary toilet.  3-HYPERLIPIDEMIA: Continue statins.  4-CORONARY ARTERY DISEASE: appears to be stable as per cardiology recommendations  and assessment; CE negative; 2-D echo pending. Continue b-blockers, aspirin and  Statins  5-Atrial fibrillation: Currently rate controlled; continue carvedilol and warfarin per pharmacy.   6-GERD: Continue PPI; now BID for better control.  7-Other dysphagia: per speech therapy, continue regular diet with thin liquids; but to make sure he chew properly and follow techniques to decrease shocking/aspiration risk.  8-BENIGN PROSTATIC  HYPERTROPHY, HX OF: Continue finasteride  9-DVT: Patient on Coumadin.    LOS: 2 days   Kathya Wilz [facility] [phone]  11/02/2011, 7:55 AM

## 2011-11-02 NOTE — Progress Notes (Signed)
Physical Therapy Evaluation Patient Details Name: Clayton Gordon MRN: [id number] DOB: 11-17-1914 Today's Date: 11/02/2011 Time: 4098-1191 Charge: EVII  Problem List:  Patient Active Problem List  Diagnoses  . HYPERLIPIDEMIA  . MACULAR DEGENERATION  . CORONARY ARTERY DISEASE  . Atrial fibrillation  . HYPOTENSION, ORTHOSTATIC  . PNEUMONIA, ORGAN UNSPECIFIED  . BRONCHITIS  . ASTHMA  . GERD  . PSORIASIS  . BAKER'S CYST, RIGHT KNEE  . LEG PAIN, RIGHT  . EDEMA  . Other dysphagia  . HYPOXEMIA  . SEVERE SEPSIS  . BENIGN PROSTATIC HYPERTROPHY, HX OF  . PACEMAKER, PERMANENT  . MITRAL REGURGITATION  . ABDOMINAL AORTIC ANEURYSM  . COPD with acute exacerbation    Past Medical History:  Past Medical History  Diagnosis Date  . CAD (coronary artery disease)     s/p CABG  . Asthma   . PAF (paroxysmal atrial fibrillation)   . Bradycardia   . HTN (hypertension)   . HLD (hyperlipidemia)   . AAA (abdominal aortic aneurysm)   . Orthostatic hypotension   . Dysphagia   . Gastroesophageal reflux disease   . CHF (congestive heart failure)   . Macular degeneration   . Heart murmur   . Myocardial infarction 1989  . Pneumonia     "3 times 2010"  . Chronic diarrhea    Past Surgical History:  Past Surgical History  Procedure Date  . Pacemaker insertion 2/02    [employer] Sigma  . Inguinal hernia repair     right  . Abdominal aortic aneurysm repair 2012  . Back surgery   . Insert / replace / remove pacemaker   . Cataract extraction w/ intraocular lens  implant, bilateral   . Fixation kyphoplasty lumbar spine "March/April2011"  . Coronary artery bypass graft 1996    CABG X5  . Percutaneous endoleak repair 10/19/11    PT Assessment/Plan/Recommendation PT Assessment Clinical Impression Statement: Pt would benefit from acute PT services in order to improve independence with transfers and mobility for safe d/c home with spouse. PT Recommendation/Assessment: Patient will need  skilled PT in the acute care venue PT Problem List: Decreased activity tolerance;Decreased mobility PT Therapy Diagnosis : Difficulty walking PT Plan PT Frequency: Min 3X/week PT Treatment/Interventions: DME instruction;Gait training;Functional mobility training;Therapeutic activities;Therapeutic exercise;Patient/family education PT Recommendation Follow Up Recommendations: 24 hour supervision/assistance Equipment Recommended: None recommended by PT PT Goals  Acute Rehab PT Goals PT Goal Formulation: With patient Time For Goal Achievement: 7 days Pt will go Supine/Side to Sit: with supervision PT Goal: Supine/Side to Sit - Progress: Not met Pt will go Sit to Stand: with supervision PT Goal: Sit to Stand - Progress: Not met Pt will Ambulate: >150 feet;with supervision;with least restrictive assistive device PT Goal: Ambulate - Progress: Not met  PT Evaluation Precautions/Restrictions  Precautions Precautions: Fall Precaution Comments: Pt legally blind. Prior Functioning  Home Living Lives With: Spouse Type of Home: House Home Layout: One level Home Access: Level entry Home Adaptive Equipment: [place] - four wheeled;Straight cane (rollator) Prior Function Level of Independence: Independent with basic ADLs;Requires assistive device for independence Comments: Reports spouse doesn't work and can assist if needed Cognition Cognition Arousal/Alertness: Awake/alert Overall Cognitive Status: Appears within functional limits for tasks assessed Orientation Level: Oriented X4 Sensation/Coordination Sensation Light Touch: Appears Intact Extremity Assessment RLE Assessment RLE Assessment: Within Functional Limits LLE Assessment LLE Assessment: Within Functional Limits Mobility (including Balance) Bed Mobility Bed Mobility: No Transfers Transfers: Yes Sit to Stand: 4: Min assist;From chair/3-in-1;With upper extremity assist;With armrests  Sit to Stand Details (indicate cue type  and reason): min/guard, verbal cues for hand placement Stand to Sit: 4: Min assist;With upper extremity assist;With armrests;To chair/3-in-1 Stand to Sit Details: min/guard, verbal cues for hand placement Ambulation/Gait Ambulation/Gait: Yes Ambulation/Gait Assistance: 4: Min assist Ambulation/Gait Assistance Details (indicate cue type and reason): min/guard, occasional reaching for rail, RR increased to 35 during ambulation Ambulation Distance (Feet): 400 Feet Assistive device: Straight cane Gait Pattern: Shuffle;Decreased stride length  Balance Balance Assessed: Yes Static Standing Balance Static Standing - Comment/# of Minutes: trunk perturbations performed and pt with ankle, hip, and stepping strategies present Exercise    End of Session PT - End of Session Equipment Utilized During Treatment: Gait belt Activity Tolerance: Patient tolerated treatment well Patient left: in chair;with call bell in reach General Behavior During Session: [facility] for tasks performed Cognition: [facility] for tasks performed  Reis Pienta,KATHrine E 11/02/2011, 2:53 PM Pager: [phone]

## 2011-11-02 NOTE — Telephone Encounter (Signed)
Daughter, Annice Pih,  is very pleased with the care our cardiologists have given her father with his recent visit to the hospital this week! She wishes all of [place] at [place] a Happy New Year! [name] RN

## 2011-11-02 NOTE — Progress Notes (Signed)
ANTICOAGULATION CONSULT NOTE - Follow-Up  Pharmacy Consult for Warfarin Indication: atrial fibrillation  No Known Allergies Height: 5\' 8"  (172.7 cm) Weight: 141 lb 8.6 oz (64.2 kg) IBW/kg (Calculated) : 68.4  Vital Signs: Temp: 97.8 F (36.6 C) (12/20 0800) Temp src: Oral (12/20 0800) BP: 153/74 mmHg (12/20 0740) Pulse Rate: 73  (12/20 0740)  Labs:  Basename 11/02/11 0825 11/02/11 0100 11/02/11 0005 11/01/11 1830 11/01/11 1635 11/01/11 1100 11/01/11 0420 10/31/11 1550  HGB -- 10.9* -- -- -- -- 11.3* --  HCT -- 32.9* -- -- -- -- 34.3* 33.0*  PLT -- 128* -- -- -- -- 130* 140*  APTT -- -- -- -- -- -- -- 43*  LABPROT -- 24.4* -- -- -- 23.7* -- 24.2*  INR -- 2.15* -- -- -- 2.07* -- 2.13*  HEPARINUNFRC -- -- -- -- -- -- -- --  CREATININE -- 0.97 -- -- -- -- 1.05 1.32  CKTOTAL 49 -- 42 -- 44 -- -- --  CKMB 3.0 -- 3.1 -- 3.0 -- -- --  TROPONINI <0.30 -- <0.30 <0.30 -- -- -- --   Estimated Creatinine Clearance: 42.3 ml/min (by C-G formula based on Cr of 0.97).  Medical History: Past Medical History  Diagnosis Date  . CAD (coronary artery disease)     s/p CABG  . Asthma   . PAF (paroxysmal atrial fibrillation)   . Bradycardia   . HTN (hypertension)   . HLD (hyperlipidemia)   . AAA (abdominal aortic aneurysm)   . Orthostatic hypotension   . Dysphagia   . Gastroesophageal reflux disease   . CHF (congestive heart failure)   . Macular degeneration   . Heart murmur   . Myocardial infarction 1989  . Pneumonia     "3 times 2010"  . Chronic diarrhea     Medications:  Scheduled:     . aspirin  325 mg Oral Daily  . carvedilol  25 mg Oral BID WC  . finasteride  5 mg Oral Daily  . methylPREDNISolone (SOLU-MEDROL) injection  40 mg Intravenous Q12H  . moxifloxacin  400 mg Oral q1800  . pantoprazole  40 mg Oral BID AC  . simvastatin  5 mg Oral q1800  . warfarin  2.5 mg Oral ONCE-1800  . DISCONTD: pantoprazole  40 mg Oral Q1200   Patient's usual warfarin dosage is  reportedly 5mg  daily except 2.5mg  on Tuesdays and Thursdays.   Assessment: -77 yo M with PAF, on chronic warfarin.  -Concomitant moxifloxacin noted - can increase INR on warfarin. -INR therapeutic.  Goal of Therapy:  INR 2-3   Plan: -Warfarin 2.5mg  PO today. -Follow INR daily.   Elie Goody, Pharm.D.  [phone] 11/02/2011 9:55 AM

## 2011-11-02 NOTE — Progress Notes (Signed)
UR COMPLETED  

## 2011-11-03 LAB — PROTIME-INR
INR: 2.82 — ABNORMAL HIGH (ref 0.00–1.49)
Prothrombin Time: 30.1 seconds — ABNORMAL HIGH (ref 11.6–15.2)

## 2011-11-03 MED ORDER — MOXIFLOXACIN HCL 400 MG PO TABS: 400.0000 mg | ORAL_TABLET | Freq: Every day | ORAL | Status: AC

## 2011-11-03 MED ORDER — WARFARIN SODIUM 1 MG PO TABS
1.5000 mg | ORAL_TABLET | Freq: Once | ORAL | Status: DC
  Filled 2011-11-03: qty 1

## 2011-11-03 MED ORDER — PREDNISONE 20 MG PO TABS: ORAL_TABLET | ORAL | Status: AC

## 2011-11-03 MED ORDER — BENZONATATE 100 MG PO CAPS: 100.0000 mg | ORAL_CAPSULE | Freq: Three times a day (TID) | ORAL | Status: AC | PRN

## 2011-11-03 NOTE — Progress Notes (Signed)
SNF contract with [facility] referral was given to in house rep for [email or web address]

## 2011-11-03 NOTE — Progress Notes (Signed)
ANTICOAGULATION CONSULT NOTE - Follow-Up  Pharmacy Consult for Warfarin Indication: atrial fibrillation  No Known Allergies Height: 5\' 8"  (172.7 cm) Weight: 142 lb 4.8 oz (64.547 kg) IBW/kg (Calculated) : 68.4   Labs:  Basename 11/03/11 0442 11/02/11 0825 11/02/11 0100 11/02/11 0005 11/01/11 1830 11/01/11 1635 11/01/11 1100 11/01/11 0420 10/31/11 1550  HGB -- -- 10.9* -- -- -- -- 11.3* --  HCT -- -- 32.9* -- -- -- -- 34.3* 33.0*  PLT -- -- 128* -- -- -- -- 130* 140*  APTT -- -- -- -- -- -- -- -- 43*  LABPROT 30.1* -- 24.4* -- -- -- 23.7* -- --  INR 2.82* -- 2.15* -- -- -- 2.07* -- --  HEPARINUNFRC -- -- -- -- -- -- -- -- --  CREATININE -- -- 0.97 -- -- -- -- 1.05 1.32  CKTOTAL -- 49 -- 42 -- 44 -- -- --  CKMB -- 3.0 -- 3.1 -- 3.0 -- -- --  TROPONINI -- <0.30 -- <0.30 <0.30 -- -- -- --   Estimated Creatinine Clearance: 42.5 ml/min (by C-G formula based on Cr of 0.97).  Medical History: Past Medical History  Diagnosis Date  . CAD (coronary artery disease)     s/p CABG  . Asthma   . PAF (paroxysmal atrial fibrillation)   . Bradycardia   . HTN (hypertension)   . HLD (hyperlipidemia)   . AAA (abdominal aortic aneurysm)   . Orthostatic hypotension   . Dysphagia   . Gastroesophageal reflux disease   . CHF (congestive heart failure)   . Macular degeneration   . Heart murmur   . Myocardial infarction 1989  . Pneumonia     "3 times 2010"  . Chronic diarrhea     Medications:  Scheduled:     . albuterol-ipratropium  2 puff Inhalation TID  . aspirin  325 mg Oral Daily  . carvedilol  25 mg Oral BID WC  . finasteride  5 mg Oral Daily  . Fluticasone-Salmeterol  1 puff Inhalation Daily  . isosorbide mononitrate  90 mg Oral Daily  . moxifloxacin  400 mg Oral q1800  . pantoprazole  40 mg Oral BID AC  . polyvinyl alcohol  1 drop Both Eyes BID  . predniSONE  80 mg Oral Q breakfast  . simvastatin  5 mg Oral q1800  . warfarin  2.5 mg Oral ONCE-1800  . DISCONTD:  methylPREDNISolone (SOLU-MEDROL) injection  40 mg Intravenous Q12H  . DISCONTD: pantoprazole  40 mg Oral BID AC  . DISCONTD: Polyethyl Glycol-Propyl Glycol  1 drop Both Eyes BID   Patient's usual warfarin dosage is reportedly 5mg  daily except 2.5mg  on Tuesdays and Thursdays.   Assessment: -77 yo M with PAF, on chronic warfarin.  -Concomitant moxifloxacin and prednisone noted - can increase INR on warfarin. -INR therapeutic, but increased significantly overnight following 2.5 mg doses.  Goal of Therapy:  INR 2-3   Plan: -Warfarin 1.5mg  PO today. -Follow INR daily.   Clance Boll, PharmD Pager: [phone] 11/03/2011 10:47 AM

## 2011-11-03 NOTE — Progress Notes (Signed)
   SUBJECTIVE:     PHYSICAL EXAM Filed Vitals:   11/02/11 2141 11/02/11 2240 11/03/11 0530 11/03/11 0831  BP: 108/50  122/63   Pulse: 66  62   Temp:   97.5 F (36.4 C)   TempSrc:   Oral   Resp:   20   Height:      Weight:   142 lb 4.8 oz (64.547 kg)   SpO2:  97% 94% 97%   General:  No distress Lungs:  Course crackles Heart:  RRR Abdomen:  Positive bowel sounds, no rebound no guarding Extremities:  No edema.  LABS: Lab Results  Component Value Date   CKTOTAL 49 11/02/2011   CKMB 3.0 11/02/2011   TROPONINI <0.30 11/02/2011   Results for orders placed during the hospital encounter of 10/31/11 (from the past 24 hour(s))  PROTIME-INR     Status: Abnormal   Collection Time   11/03/11  4:42 AM      Component Value Range   Prothrombin Time 30.1 (*) 11.6 - 15.2 (seconds)   INR 2.82 (*) 0.00 - 1.49     Intake/Output Summary (Last 24 hours) at 11/03/11 0840 Last data filed at 11/03/11 0600  Gross per 24 hour  Intake   1665 ml  Output    520 ml  Net   1145 ml   ECHO:  - Left ventricle: The cavity size was normal. There was moderate concentric hypertrophy. Systolic function was normal. The estimated ejection fraction was in the range of 50% to 55%. Wall motion was normal; there were no regional wall motion abnormalities. - Aortic valve: Valve mobility was restricted. There was mild stenosis. - Mitral valve: Calcified annulus. Mild to moderate regurgitation. - Left atrium: The atrium was moderately dilated. - Right ventricle: The cavity size was mildly dilated. - Right atrium: The atrium was moderately dilated. - Tricuspid valve: Moderate regurgitation. - Pulmonary arteries: Systolic pressure was severely increased.   ASSESSMENT AND PLAN:  1)  Chest pain:    Denies chest pain this am..  No objective evidence of ischemia.  No further work up.  Echo as above.  2)  Atrial fibrillation:  Warfarin per pharmacy.  Pacer is followed by Dr. Ladona Ridgel.  Call us if  needed.  Fayrene Fearing [facility] 11/03/2011 8:40 AM

## 2011-11-03 NOTE — Discharge Summary (Signed)
Physician Discharge Summary  Patient ID: Clayton Gordon MRN: [id number] DOB/AGE: 12-31-1914 77 y.o.  Admit date: 10/31/2011 Discharge date: 11/03/2011  Primary Care Physician:  Ginette Otto, MD, MD   Discharge Diagnoses:   1-chest pain (noncardiac) most likely associated with COPD exacerbation and persistent cough. 2-COPD with acute exacerbation. 3-hyperlipidemia. 4-Atrial fibrillation. 5-BPH. 6-macular degeneration. 7-coronary artery disease. 8-GERD   Present on Admission:  .Chest pain .COPD with acute exacerbation .HYPERLIPIDEMIA .MACULAR DEGENERATION .CORONARY ARTERY DISEASE .Atrial fibrillation .HYPOTENSION, ORTHOSTATIC .Other dysphagia .BENIGN PROSTATIC HYPERTROPHY, HX OF .PACEMAKER, PERMANENT .GERD  Current Discharge Medication List    START taking these medications   Details  benzonatate (TESSALON) 100 MG capsule Take 1 capsule (100 mg total) by mouth 3 (three) times daily as needed for cough. Qty: 30 capsule, Refills: 0    moxifloxacin (AVELOX) 400 MG tablet Take 1 tablet (400 mg total) by mouth daily at 6 PM. Qty: 8 tablet, Refills: 0    predniSONE (DELTASONE) 20 MG tablet Take 3 tablets by mouth once a day x2 days, 2 tablets by mouth once a day x2 days, then one tablet by mouth once a day x2 days, then half tablet by mouth once a day x2 days and stop taking prednisone. Qty: 14 tablet, Refills: 0      CONTINUE these medications which have NOT CHANGED   Details  calcium-vitamin D (OSCAL WITH D) 500-200 MG-UNIT per tablet Take 1 tablet by mouth daily.      carvedilol (COREG) 25 MG tablet Take 1 tab twice a day     cholecalciferol (VITAMIN D) 1000 UNITS tablet Take 1,000 Units by mouth daily.      finasteride (PROSCAR) 5 MG tablet Take 1 tablet by mouth daily.    Fluticasone-Salmeterol (ADVAIR DISKUS) 250-50 MCG/DOSE AEPB Inhale 1 puff into the lungs daily.     isosorbide mononitrate (IMDUR) 60 MG 24 hr tablet Take 90 mg by mouth daily. Takes 1  & 1/2 tab daily     loperamide (IMODIUM A-D) 2 MG tablet Take 2 mg by mouth 4 (four) times daily as needed. For diarrhea     Multiple Vitamin (MULTIVITAMIN) tablet Take 1 tablet by mouth daily.      nitroGLYCERIN (NITROSTAT) 0.4 MG SL tablet Place 0.4 mg under the tongue every 5 (five) minutes as needed. For chest pain     Polyethyl Glycol-Propyl Glycol (SYSTANE OP) Place 1 drop into both eyes 2 (two) times daily.      pravastatin (PRAVACHOL) 20 MG tablet Take 20 mg by mouth daily.      Probiotic Product (RESTORA PO) Take by mouth daily.     RABEprazole (ACIPHEX) 20 MG tablet Take 20 mg by mouth daily.      ranitidine (ZANTAC) 150 MG tablet Take 150 mg by mouth 2 (two) times daily.      vitamin C (ASCORBIC ACID) 500 MG tablet Take 500 mg by mouth daily.      warfarin (COUMADIN) 5 MG tablet Take 2.5-5 mg by mouth as directed. Takes 1/2 tablet on tuesdays and thursdays and 1 tablet on all other days       STOP taking these medications     dextromethorphan-guaiFENesin (MUCINEX DM) 30-600 MG per 12 hr tablet          Disposition and Follow-up:  Patient has been discharged in a stable and improved condition, coronary no complaining of any chest pain, no shortness of breath and with chills mild wheezing. He has been instructed to follow  up tapering prednisone and also antibiotics in order to completely treat his COPD exacerbation. He has also received instructions to arrange followup appointment with primary care physician over the next 7-10 days in order to assure resolution of his symptoms and to have further adjustment into his chronic medication regimen. Patient diet has been changed to soft mechanical diet and thin liquids in order to decrease the risk of aspiration and try to prevent choking episodes.  Consults:  Cardiology Corinda Gubler)   Significant Diagnostic Studies:  Dg Chest 2 View  10/31/2011  *RADIOLOGY REPORT*  Clinical Data: Shortness of breath.  Cough.  Wheezing.   CHEST - 2 VIEW  Comparison: 03/30/2011  Findings: Both lungs are clear.  Pulmonary hyperinflation again noted.  A tiny right posterior pleural effusion or thickening is noted.  There is no evidence of pulmonary edema.  Mild cardiomegaly stable.  Dual lead transvenous post anchor remains in appropriate position.  Prior CABG again noted.  IMPRESSION:  Tiny right posterior pleural effusion versus thickening.  Otherwise clear lungs.  Original Report Authenticated By: Danae Orleans, M.D.    Brief H and P: For complete details please refer to admission H and P, but in brief 77 year old male with a past medical history significant for chest pain, coronary artery disease, hyperlipidemia and COPD; who came into the hospital complaining of chest pain and shortness of breath. Patient reports initially that his pain was similar to the pain that he experienced when he had a heart attack many years ago; at that moment patient was started on nitroglycerin drip (with some relief of his chest pain); cardiology was consulted and [facility] hospitalist was also called to admit the patient for further evaluation and treatment. In the ED patient was also found to be having significant wheezing, and cough. Chest x-ray with chronic emphysema does changes and difficult to differentiate if there is any new infiltrates on it. Patient was admitted to rule out ACS and for treatment of his mild COPD exacerbation.  Hospital Course:  1-chest pain (noncardiac) most likely associated with COPD exacerbation and persistent cough: Negative cardiac enzymes, normal 2-D echo. Per cardiology no further workup required at this point and just continuation of the treatment that he is already on for his coronary artery disease. Most likely explanation for his chest pain was in fact, COPD exacerbation and persistent cough. Other coadjuvant concern to his CP was GERD.  2-COPD with acute exacerbation: Patient initially started on Solu Medrol and IV Avelox.  He was also placed on dual nebulizer treatments. After 48 hours of IV treatment his symptoms improved dramatically and he has been able to being discharge home on tapering steroids and by mouth antibiotics. Patient has been advised to arrange followup with PCP over the next 7-10 days to assure complete resolution of his symptoms and for further evaluation/treatment.  3-hyperlipidemia: Continue statins. Patient advised to follow low fat diet.  4-Atrial fibrillation: Status post pacemaker. Continue beta blocker and also Coumadin.  5-BPH: Continue Proscar.  6-macular degeneration: Stable. At this point he is legally blind. He will continue using his cane for ambulation.  7-coronary artery disease: Continue current medications as instructed per cardiology. He will continue following as needed by Dr. Maisie Fus wall (cardiologist). Patient instructed to follow a heart healthy diet and to be compliant with his medications.  8-GERD: Continue PPI  9-dysphagia: Patient has been evaluated by speech therapy but in this hospitalization, recommendations provided by Jesusita Oka is to change his diet to soft mechanical diet with  thin liquids; in order to prevent choking and decrease the risk of aspiration.  The rest of his medical problems remains stable throughout this hospitalization and no changes has been made to his medication regimen.  Time spent on Discharge: 45 minutes  Signed: Yasmen Cortner 11/03/2011, 2:10 PM

## 2011-11-09 ENCOUNTER — Telehealth: Payer: Self-pay | Admitting: Emergency Medicine

## 2011-11-09 ENCOUNTER — Encounter: Payer: Self-pay | Admitting: Internal Medicine

## 2011-11-09 DIAGNOSIS — I459 Conduction disorder, unspecified: Secondary | ICD-10-CM

## 2011-11-09 IMAGING — CR DG CHEST 2V
1 series · 1 of 1 positions shown · non-contrast
Comparison: 01/07/2011.

CLINICAL DATA: Fell 1 week ago.  Bilateral hip pain.  Ex-smoker.

CHEST - 2 VIEW

[view not recorded]
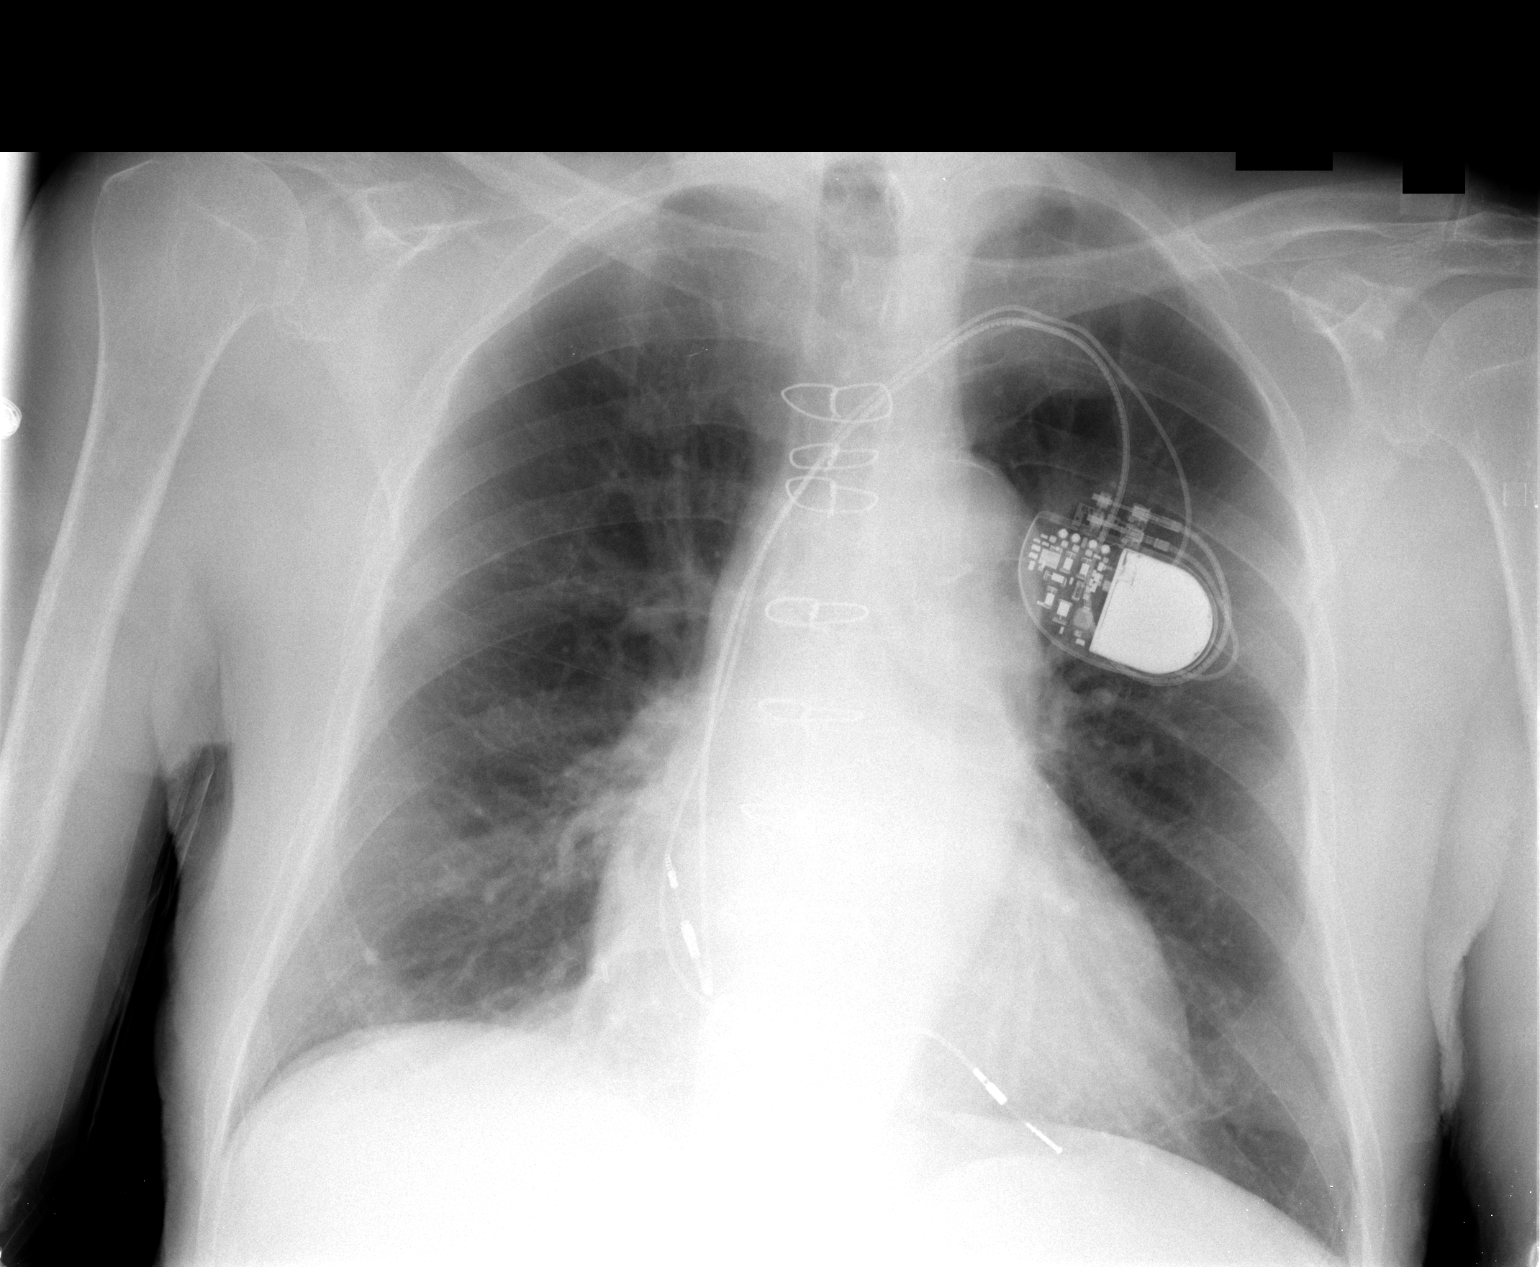

[1 of 1 positions shown; findings below may reference images not displayed]

FINDINGS: The cardiac silhouette remains borderline enlarged.
Stable post CABG changes and left subclavian pacemaker leads.
Stable small calcified granuloma at the right lung base.
Otherwise, clear lungs. The lungs remain mildly hyperexpanded.
Thoracic spine degenerative changes.  Diffuse osteopenia.
IMPRESSION: Stable mild changes of COPD.  No acute abnormality.

## 2011-11-09 IMAGING — CT CT ABD-PELV W/ CM
2 of 5 series · 15 of 46 positions shown, 17 images · IV contrast (APPLIED)
Comparison: CT of the abdomen and pelvis performed 12/13/2010

CLINICAL DATA: Right groin pain and abdominal pain for 2 days.

CT ABDOMEN AND PELVIS WITH CONTRAST
TECHNIQUE: Multidetector CT imaging of the abdomen and pelvis was
performed following the standard protocol during bolus
administration of intravenous contrast.
Contrast: 80 mL of Omnipaque 300 IV contrast

[Series 2: abd/pelv with 5.0 b31f st · axial · 0.65mm/px · z∈[-729,-334]mm · 12 of 89 slices shown, 14 images]
[im 5/89  soft-tissue]
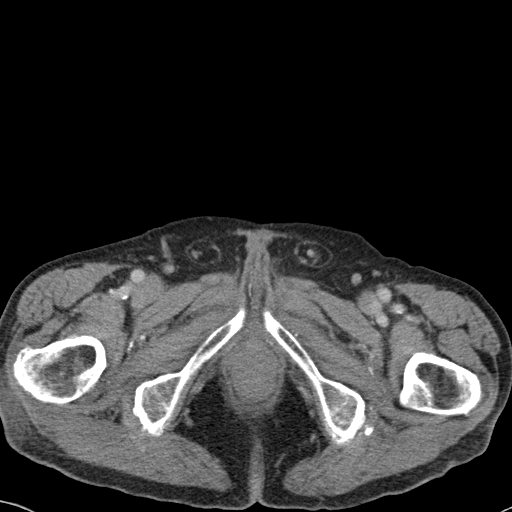
[im 5/89  bone]
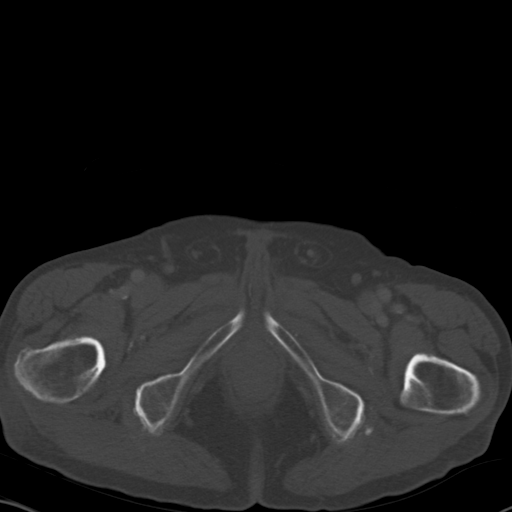
[im 15/89  soft-tissue]
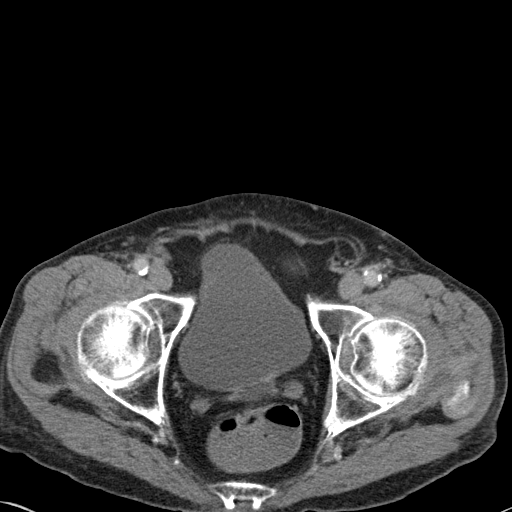
[im 20/89  soft-tissue]
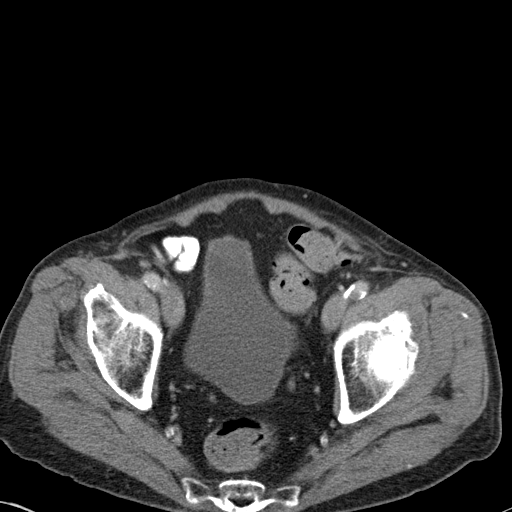
[im 25/89  soft-tissue]
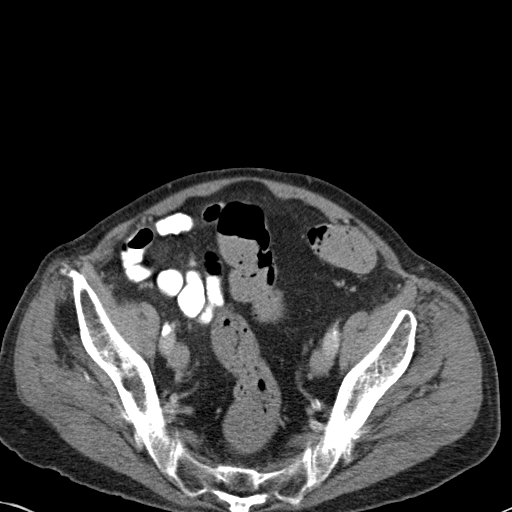
[im 35/89  soft-tissue]
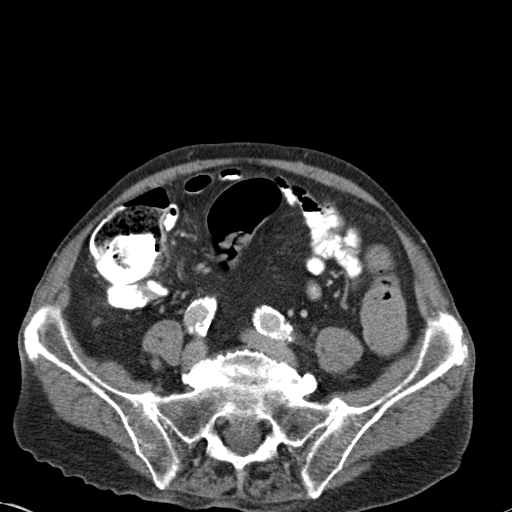
[im 40/89  soft-tissue]
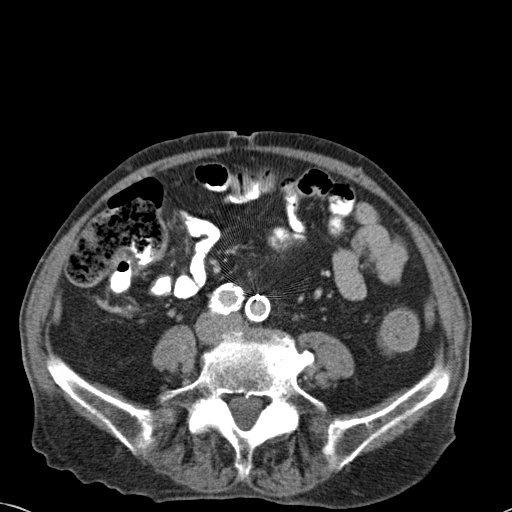
[im 49/89  soft-tissue]
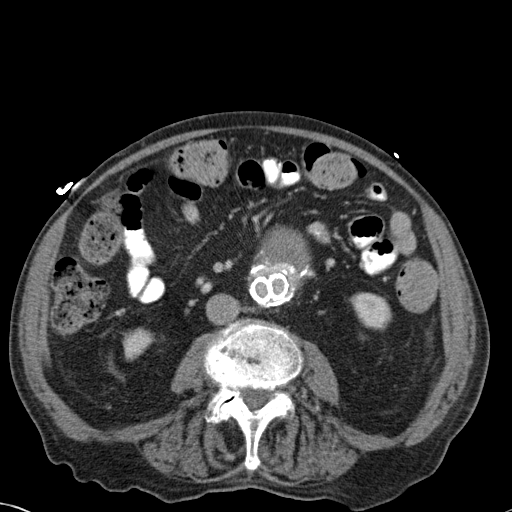
[im 54/89  soft-tissue]
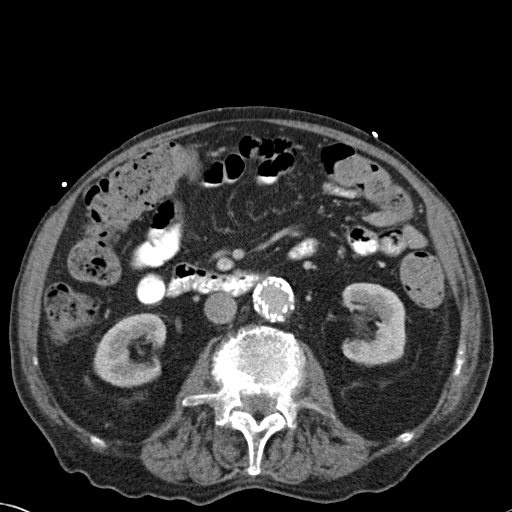
[im 64/89  soft-tissue]
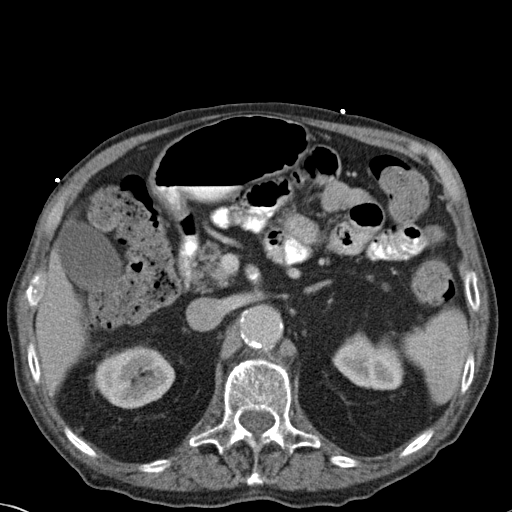
[im 64/89  bone]
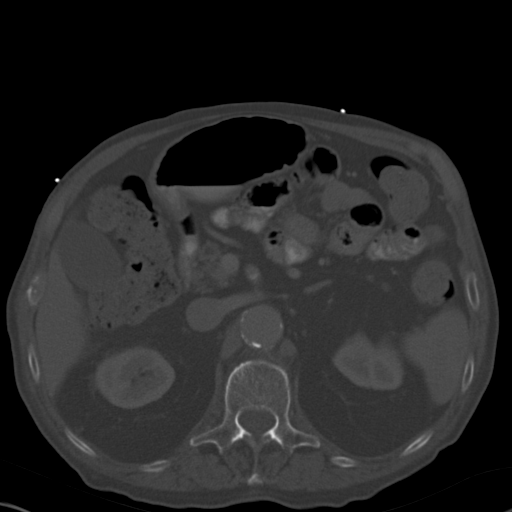
[im 69/89  soft-tissue]
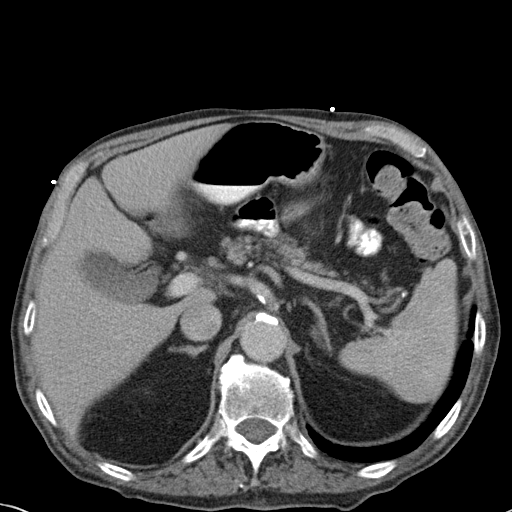
[im 74/89  soft-tissue]
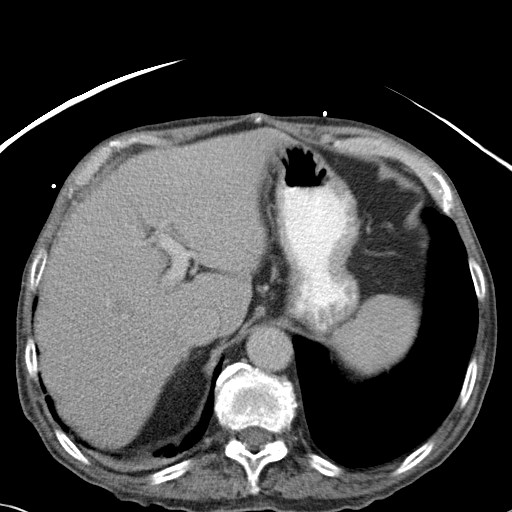
[im 84/89  soft-tissue]
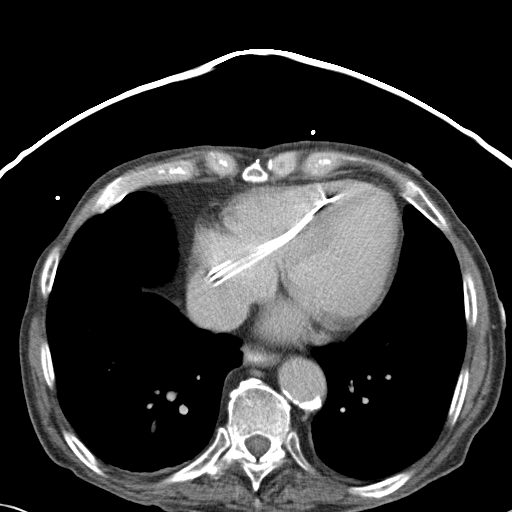

[Series 602: cor · coronal · 0.87mm/px · 3 of 131 slices shown]
[im 44/131  soft-tissue]
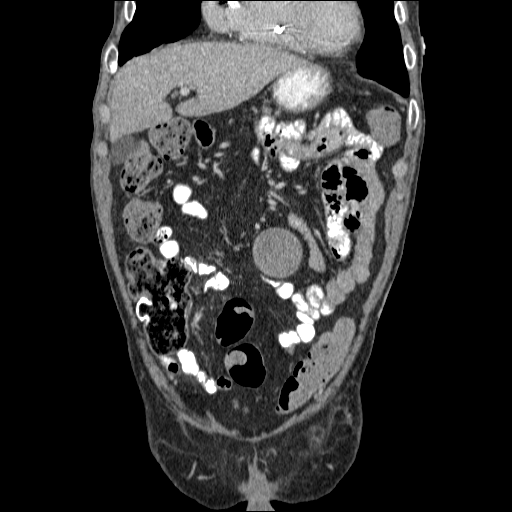
[im 58/131  soft-tissue]
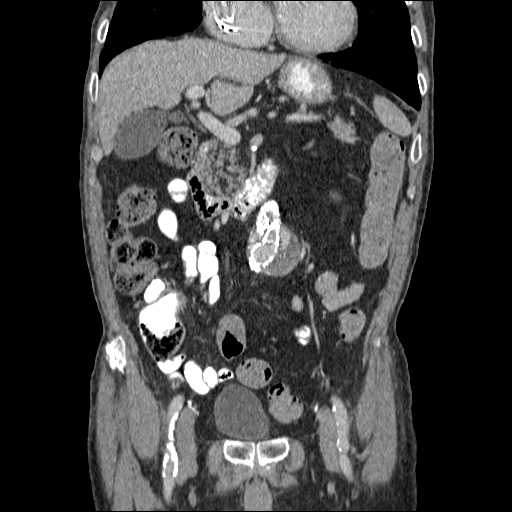
[im 73/131  soft-tissue]
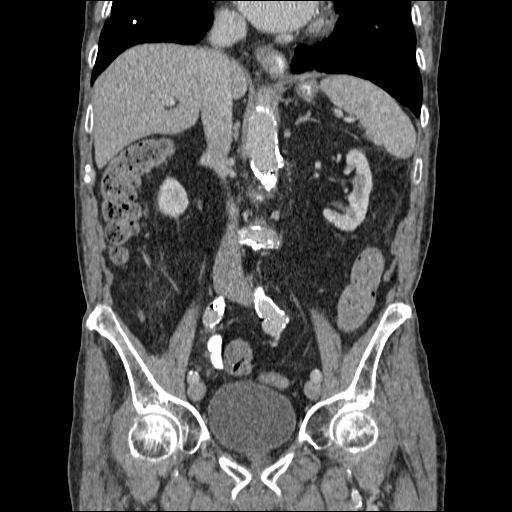

[15 of 46 positions shown; findings below may reference images not displayed]

FINDINGS: Mild right basilar atelectasis is noted; calcified
granulomata are noted at both lung bases.  Pacemaker wires are
partially imaged.  Diffuse coronary artery calcifications are seen.

A small calcified granuloma is noted at the right hepatic dome.
The liver is otherwise unremarkable in appearance.  The spleen is
within normal limits.  The gallbladder is normal in appearance.
The pancreas and adrenal glands are unremarkable.

Bilateral nonspecific perinephric stranding is noted.  There is
mild bilateral renal atrophy; the kidneys are otherwise
unremarkable in appearance.  No hydronephrosis is seen.  No renal
or ureteral stones are identified.

No free fluid is identified.  The small bowel is unremarkable in
appearance.  A tiny hiatal hernia is noted; the stomach is
otherwise unremarkable in appearance.

The patient is status post aortoiliac stent graft; a persistent
patent aneurysmal lumen is noted surrounding the stent graft just
above the level of the aortic bifurcation.  The patent portion of
the aneurysm has decreased mildly in size since the prior study,
reflecting stent graft placement.  The overlying aneurysm measures
approximately 6.7 x 5.0 cm, essentially unchanged from the prior
study given differences in measurement technique.

The common iliac arteries and internal and external iliac arteries
appear fully patent.  Diffuse calcification is noted along the
visualized arterial vasculature.  No acute vascular abnormalities
are seen.

The appendix is normal in caliber, without evidence for
appendicitis.  Contrast progresses to the level of the cecum.  The
colon is filled with stool and is unremarkable in appearance.

The bladder is mildly distended and is grossly unremarkable in
appearance.  The prostatic urethra is mildly distended.  There is
mild haziness about the prostate, though it remains normal in size.
Small bilateral inguinal hernias are noted, containing only fat.
No inguinal lymphadenopathy is seen.

There is new mild compression deformity of vertebral body L2, with
fragmentation and mild 4 mm retropulsion involving the superior
endplate.  Associated new vacuum phenomenon is noted along the
lumbar spine.  Multilevel disc space narrowing is seen; posterior
disc protrusions are noted along the lumbar spine.  Bilateral facet
disease is noted.
IMPRESSION: 1.  No acute bowel abnormalities seen.
2.  Status post aortoiliac stent graft; persistent patent
aneurysmal lumen noted surrounding the stent graft just above the
level of the aortic bifurcation.  The patent portion has decreased
mildly in size since the prior study, reflecting stent graft
placement.  Overlying aneurysm essentially unchanged in size,
measuring 6.7 x 5.0 cm.
3.  New mild compression deformity of vertebral body L2, with
fragmentation and mild 4 mm retropulsion involving the superior
endplate, and new vacuum phenomenon along the lumbar spine.
4.  Diffuse coronary artery calcifications seen; diffuse
calcification along the abdominal aorta and its branches.
5.  Small bilateral inguinal hernias, containing only fat.
6.  Mild bilateral renal atrophy noted.
7.  Mild bibasilar atelectasis; bibasilar calcified granulomata
seen.
8.  Tiny hiatal hernia noted.

## 2011-11-09 NOTE — Telephone Encounter (Signed)
LM FOR QUICK CARE COORD. TO SEE IF PT CAN BE SEEN TODAY OR TOMORROW FOR A POSSIBLE LT SIDED HERNIA.  KAREN W/ [facility] GAVE ME AN APPT FOR PT TO SEE DR N. GATES ON 11-10-11 AT 4:15PM  TRIED CALLING PT WIFE BACK AT 10:26AM- NO ANSWER AT HOME #  12:15- SPOKE W/ PT WIFE AND GAVE HER APPT TO SEE DR GATES AT 4:15PM/REGISTER AT 4:00PM ON 11-10-11, IF THINGS WORSEN TO TAKE HIM TO THE ER.  ALSO MADE F/U APPT HERE W/ DR Lanier Prude FOR 11-21-11 AT 10:15AM.

## 2011-11-15 ENCOUNTER — Telehealth: Payer: Self-pay | Admitting: Cardiology

## 2011-11-15 NOTE — Telephone Encounter (Signed)
New problem:  Per Stephaine. Pt started on Z- PACK today. Pt on coumadin .

## 2011-11-15 NOTE — Telephone Encounter (Signed)
Pt states he restarted his coumadin a few days after his Sx, He will start Z-pk today, per pharmacist pt can wait to be checked on Monday.

## 2011-11-20 ENCOUNTER — Ambulatory Visit (INDEPENDENT_AMBULATORY_CARE_PROVIDER_SITE_OTHER): Payer: Medicare Other | Admitting: *Deleted

## 2011-11-20 DIAGNOSIS — I4891 Unspecified atrial fibrillation: Secondary | ICD-10-CM

## 2011-11-21 ENCOUNTER — Ambulatory Visit
Admission: RE | Admit: 2011-11-21 | Discharge: 2011-11-21 | Disposition: A | Discharge status: Home / Self Care | Payer: Medicare Other | Source: Ambulatory Visit | Attending: Interventional Radiology | Admitting: Interventional Radiology

## 2011-11-21 ENCOUNTER — Other Ambulatory Visit (HOSPITAL_COMMUNITY): Payer: Self-pay | Admitting: Interventional Radiology

## 2011-11-21 DIAGNOSIS — Z95828 Presence of other vascular implants and grafts: Secondary | ICD-10-CM

## 2011-11-21 DIAGNOSIS — R1032 Left lower quadrant pain: Secondary | ICD-10-CM

## 2011-11-21 DIAGNOSIS — T82330A Leakage of aortic (bifurcation) graft (replacement), initial encounter: Secondary | ICD-10-CM

## 2011-11-21 DIAGNOSIS — Z8679 Personal history of other diseases of the circulatory system: Secondary | ICD-10-CM

## 2011-11-21 NOTE — Progress Notes (Signed)
Pt states that he called Dr Laverle Hobby office on 11/15/2011 for "a bad cold' and was Rx'd w/ Zpack.  States that he has been afebrile but that he has had a productive cough of cloudy sputum.  C/o frequent diarrhea.    C/o constant left sided groin discomfort.  States that he has not been taking pain meds.

## 2011-11-29 ENCOUNTER — Ambulatory Visit (HOSPITAL_COMMUNITY)
Admission: RE | Admit: 2011-11-29 | Discharge: 2011-11-29 | Disposition: A | Discharge status: Home / Self Care | Payer: Medicare Other | Source: Ambulatory Visit | Attending: Interventional Radiology | Admitting: Interventional Radiology

## 2011-11-29 DIAGNOSIS — Z95828 Presence of other vascular implants and grafts: Secondary | ICD-10-CM

## 2011-11-29 DIAGNOSIS — R1032 Left lower quadrant pain: Secondary | ICD-10-CM

## 2011-11-29 DIAGNOSIS — R918 Other nonspecific abnormal finding of lung field: Secondary | ICD-10-CM | POA: Insufficient documentation

## 2011-11-29 DIAGNOSIS — K4090 Unilateral inguinal hernia, without obstruction or gangrene, not specified as recurrent: Secondary | ICD-10-CM | POA: Insufficient documentation

## 2011-11-29 DIAGNOSIS — R109 Unspecified abdominal pain: Secondary | ICD-10-CM | POA: Insufficient documentation

## 2011-11-29 MED ORDER — IOHEXOL 350 MG/ML SOLN
100.0000 mL | Freq: Once | INTRAVENOUS | Status: AC | PRN
  Administered 2011-11-29: 100 mL via INTRAVENOUS

## 2011-12-04 ENCOUNTER — Telehealth: Payer: Self-pay | Admitting: Radiology

## 2011-12-04 NOTE — Telephone Encounter (Signed)
Phone call to check w/ pt & his wife regarding f/u office visit to review CT scan of 11/29/2011.  Mrs.  Pignato states that she will call back if they decide to schedule an appointment.

## 2011-12-07 ENCOUNTER — Encounter: Payer: Self-pay | Admitting: Internal Medicine

## 2011-12-07 DIAGNOSIS — I459 Conduction disorder, unspecified: Secondary | ICD-10-CM

## 2011-12-15 ENCOUNTER — Telehealth: Payer: Self-pay | Admitting: Cardiology

## 2011-12-15 NOTE — Telephone Encounter (Signed)
Lawson Fiscal RN from [employer] office called, regarding Cardiac clearance form faxed to Dr. Daleen Squibb on 12/13/11. Patient needs to be off coumadin for 5 days prior Epidural steroid injection. Lawson Fiscal states, patient is in excruciated pain, and if he can be clear on Monday 02/04 , the injection will be given on Friday same week.

## 2011-12-15 NOTE — Telephone Encounter (Signed)
New problem:  Faxed request on 1/30. For cardiac clearance- off coumadin for 5 days. Epidural steroid injection.

## 2011-12-18 ENCOUNTER — Ambulatory Visit (INDEPENDENT_AMBULATORY_CARE_PROVIDER_SITE_OTHER): Payer: Medicare Other | Admitting: *Deleted

## 2011-12-18 ENCOUNTER — Encounter: Payer: Medicare Other | Admitting: *Deleted

## 2011-12-18 DIAGNOSIS — I4891 Unspecified atrial fibrillation: Secondary | ICD-10-CM

## 2011-12-18 LAB — POCT INR: INR: 3

## 2011-12-19 ENCOUNTER — Encounter: Payer: Self-pay | Admitting: *Deleted

## 2011-12-19 NOTE — Telephone Encounter (Signed)
Follow up on previous call:  Stat request sent on 1/30. Regarding Coumadin to be stop 5 day prior.

## 2011-12-19 NOTE — Telephone Encounter (Signed)
Will re route to Dr Daleen Squibb to address and will page him.

## 2011-12-29 ENCOUNTER — Encounter (INDEPENDENT_AMBULATORY_CARE_PROVIDER_SITE_OTHER): Payer: Self-pay

## 2012-01-02 ENCOUNTER — Ambulatory Visit (INDEPENDENT_AMBULATORY_CARE_PROVIDER_SITE_OTHER): Payer: Medicare Other | Admitting: General Surgery

## 2012-01-02 ENCOUNTER — Encounter (INDEPENDENT_AMBULATORY_CARE_PROVIDER_SITE_OTHER): Payer: Self-pay | Admitting: General Surgery

## 2012-01-02 VITALS — BP 132/82 | HR 112 | Temp 97.5°F | Ht 68.0 in | Wt 122.2 lb

## 2012-01-02 DIAGNOSIS — K4090 Unilateral inguinal hernia, without obstruction or gangrene, not specified as recurrent: Secondary | ICD-10-CM

## 2012-01-02 NOTE — Patient Instructions (Signed)
I recommend you get a pair of hernia underwear.

## 2012-01-02 NOTE — Progress Notes (Signed)
Patient ID: Clayton Gordon, male   DOB: [birth year], 78 y.o.   MRN: [id number]  Chief Complaint  Patient presents with  . Pre-op Exam    eval painful LIH    HPI Clayton Gordon is a 78 y.o. male.   HPI He is referred by Dr. Pete Glatter for evaluation of a painful left inguinal hernia. He has been coughing quite a bit secondary to pneumonia. He also has some COPD. A painful swollen area was noted in the left groin. A CT scan last month demonstrated this to be a small left inguinal hernia containing fat only. He is here to have that evaluated. He is in a wheelchair and quite weak. No obstructing symptoms.  Past Medical History  Diagnosis Date  . CAD (coronary artery disease)     s/p CABG  . Asthma   . PAF (paroxysmal atrial fibrillation)   . Bradycardia   . HTN (hypertension)   . HLD (hyperlipidemia)   . AAA (abdominal aortic aneurysm)   . Orthostatic hypotension   . Dysphagia   . Gastroesophageal reflux disease   . CHF (congestive heart failure)   . Macular degeneration   . Heart murmur   . Myocardial infarction 1989  . Pneumonia     "3 times 2010"  . Chronic diarrhea   . Blood transfusion   . COPD (chronic obstructive pulmonary disease)   . Heart attack   . Hearing loss   . Nasal congestion   . Trouble swallowing   . Visual disturbance   . Cough   . Wheezing   . Chest pain   . Leg swelling   . Palpitations   . Constipation   . Diarrhea   . Arthritis pain   . Weakness     Past Surgical History  Procedure Date  . Pacemaker insertion 2/02    Medtronic Sigma  . Inguinal hernia repair     right  . Abdominal aortic aneurysm repair 2012  . Back surgery   . Insert / replace / remove pacemaker   . Cataract extraction w/ intraocular lens  implant, bilateral   . Fixation kyphoplasty lumbar spine "March/April2011"  . Coronary artery bypass graft 1996    CABG X5  . Percutaneous endoleak repair 10/19/11    Family History  Problem Relation Age of Onset  . Heart disease  Mother   . Heart disease Father   . Heart disease Sister   . Diabetes Daughter   . Stroke Daughter   . Stroke Son     Social History History  Substance Use Topics  . Smoking status: Former Smoker -- 1.0 packs/day for 50 years    Types: Cigarettes, Cigars    Quit date: 11/13/1985  . Smokeless tobacco: Former Neurosurgeon    Types: Chew  . Alcohol Use: Yes     social    No Known Allergies  Current Outpatient Prescriptions  Medication Sig Dispense Refill  . calcium-vitamin D (OSCAL WITH D) 500-200 MG-UNIT per tablet Take 1 tablet by mouth daily.        . carvedilol (COREG) 25 MG tablet Take 1 tab twice a day       . cholecalciferol (VITAMIN D) 1000 UNITS tablet Take 1,000 Units by mouth daily.        . finasteride (PROSCAR) 5 MG tablet Take 1 tablet by mouth daily.      . Fluticasone-Salmeterol (ADVAIR DISKUS) 250-50 MCG/DOSE AEPB Inhale 1 puff into the lungs daily.       Marland Kitchen  isosorbide mononitrate (IMDUR) 60 MG 24 hr tablet Take 90 mg by mouth daily. Takes 1 & 1/2 tab daily       . loperamide (IMODIUM A-D) 2 MG tablet Take 2 mg by mouth 4 (four) times daily as needed. For diarrhea       . Multiple Vitamin (MULTIVITAMIN) tablet Take 1 tablet by mouth daily.        . nitroGLYCERIN (NITROSTAT) 0.4 MG SL tablet Place 0.4 mg under the tongue every 5 (five) minutes as needed. For chest pain       . Polyethyl Glycol-Propyl Glycol (SYSTANE OP) Place 1 drop into both eyes 2 (two) times daily.        . pravastatin (PRAVACHOL) 20 MG tablet Take 20 mg by mouth daily.        . Probiotic Product (RESTORA PO) Take by mouth daily.       . RABEprazole (ACIPHEX) 20 MG tablet Take 20 mg by mouth daily.        . ranitidine (ZANTAC) 150 MG tablet Take 150 mg by mouth 2 (two) times daily.        . vitamin C (ASCORBIC ACID) 500 MG tablet Take 500 mg by mouth daily.        Marland Kitchen warfarin (COUMADIN) 5 MG tablet Take 2.5-5 mg by mouth as directed. Takes 1/2 tablet on tuesdays and thursdays and 1 tablet on all other  days         Review of Systems Review of Systems  Constitutional:       Weak.  Respiratory: Positive for cough.   Genitourinary:       Left groin pain and swelling.    Blood pressure 132/82, pulse 112, temperature 97.5 F (36.4 C), temperature source Temporal, height 5\' 8"  (1.727 m), weight 122 lb 3.2 oz (55.43 kg), SpO2 92.00%.  Physical Exam Physical Exam  Constitutional:       Weak, elderly male.  Abdominal: Soft. He exhibits no distension and no mass. There is no tenderness.       No umbilical hernia.  Genitourinary:       Reducible left inguinal bulge.  No right inguinal bulge.    Data Reviewed Dr. Laverle Hobby notes.  Assessment    Painful reducible left inguinal hernia. The pain is caused by his coughing. He is in no condition to have an operation.    Plan    I've recommended he purchase appeared hernia underwear as I feel this will decrease his discomfort. Return visit p.r.n.       [name] 01/02/2012, 5:45 PM

## 2012-01-08 ENCOUNTER — Encounter: Payer: Medicare Other | Admitting: *Deleted

## 2012-01-09 ENCOUNTER — Other Ambulatory Visit (HOSPITAL_COMMUNITY): Payer: Self-pay | Admitting: Geriatric Medicine

## 2012-01-09 DIAGNOSIS — R05 Cough: Secondary | ICD-10-CM

## 2012-01-11 ENCOUNTER — Encounter: Payer: Self-pay | Admitting: Internal Medicine

## 2012-01-11 DIAGNOSIS — I459 Conduction disorder, unspecified: Secondary | ICD-10-CM

## 2012-01-17 ENCOUNTER — Ambulatory Visit (HOSPITAL_COMMUNITY): Payer: Medicare Other

## 2012-01-18 ENCOUNTER — Ambulatory Visit (HOSPITAL_COMMUNITY): Admission: RE | Admit: 2012-01-18 | Payer: Medicare Other | Source: Ambulatory Visit

## 2012-01-18 ENCOUNTER — Other Ambulatory Visit (HOSPITAL_COMMUNITY): Payer: Medicare Other

## 2012-01-23 ENCOUNTER — Other Ambulatory Visit (HOSPITAL_COMMUNITY): Payer: Self-pay | Admitting: *Deleted

## 2012-01-23 ENCOUNTER — Other Ambulatory Visit (HOSPITAL_COMMUNITY): Payer: Medicare Other

## 2012-01-23 ENCOUNTER — Ambulatory Visit (HOSPITAL_COMMUNITY): Payer: Medicare Other

## 2012-01-23 DIAGNOSIS — R05 Cough: Secondary | ICD-10-CM | POA: Insufficient documentation

## 2012-01-23 DIAGNOSIS — R059 Cough, unspecified: Secondary | ICD-10-CM | POA: Insufficient documentation

## 2012-01-23 DIAGNOSIS — T17998A Other foreign object in respiratory tract, part unspecified causing other injury, initial encounter: Secondary | ICD-10-CM

## 2012-01-24 ENCOUNTER — Encounter: Payer: Self-pay | Admitting: Internal Medicine

## 2012-01-25 ENCOUNTER — Ambulatory Visit (HOSPITAL_COMMUNITY)
Admission: RE | Admit: 2012-01-25 | Discharge: 2012-01-25 | Disposition: A | Discharge status: Home / Self Care | Payer: Medicare Other | Source: Ambulatory Visit | Attending: Geriatric Medicine | Admitting: Geriatric Medicine

## 2012-01-25 ENCOUNTER — Ambulatory Visit (INDEPENDENT_AMBULATORY_CARE_PROVIDER_SITE_OTHER): Payer: Medicare Other | Admitting: Internal Medicine

## 2012-01-25 ENCOUNTER — Encounter: Payer: Self-pay | Admitting: Internal Medicine

## 2012-01-25 VITALS — BP 114/70 | HR 130 | Temp 98.6°F

## 2012-01-25 DIAGNOSIS — R05 Cough: Secondary | ICD-10-CM

## 2012-01-25 DIAGNOSIS — T17998A Other foreign object in respiratory tract, part unspecified causing other injury, initial encounter: Secondary | ICD-10-CM

## 2012-01-25 DIAGNOSIS — R1312 Dysphagia, oropharyngeal phase: Secondary | ICD-10-CM | POA: Insufficient documentation

## 2012-01-25 DIAGNOSIS — R059 Cough, unspecified: Secondary | ICD-10-CM

## 2012-01-25 DIAGNOSIS — Z87891 Personal history of nicotine dependence: Secondary | ICD-10-CM | POA: Insufficient documentation

## 2012-01-25 DIAGNOSIS — I509 Heart failure, unspecified: Secondary | ICD-10-CM

## 2012-01-25 DIAGNOSIS — J441 Chronic obstructive pulmonary disease with (acute) exacerbation: Secondary | ICD-10-CM

## 2012-01-25 MED ORDER — BUDESONIDE 0.5 MG/2ML IN SUSP: 0.5000 mg | Freq: Two times a day (BID) | RESPIRATORY_TRACT | Status: AC

## 2012-01-25 MED ORDER — FORMOTEROL FUMARATE 20 MCG/2ML IN NEBU: 20.0000 ug | INHALATION_SOLUTION | Freq: Two times a day (BID) | RESPIRATORY_TRACT | Status: AC

## 2012-01-25 NOTE — Progress Notes (Signed)
  Subjective:    Patient ID: Clayton Gordon, male    DOB: August 14, 1915, 78 y.o.   MRN: [id number]  HPI  89 yowm quit smoking around 1990 with chronic cough onset around 2010 but worse 2012 especially bad since christmas 2012  So referred by Dr Pete Glatter for pulmonary eval 01/25/12 re : ? Candidate for bronchoscopy  01/25/2012 1st pulmonary ov/ Viveca Beckstrom cc very congested rattling cough daily since 2010 but worse x 3 months with documented dysphagia and rec to puree diet and honey thickened made one day prior to ov by speech therapy though fm member said "everything was fine per st" maintained on advair and nebs not really any better with multiple abx and presently no purulent sputum fever or need for daytime 02 or sob though is w/c bound - has choked on food in past but denies any recent events.  Props up 30 degrees in hosp bed sometimes on 02 at night. Denise sinus drainage or congestion but does have some sneezing. No lateralizing chest pain.  Has not decided apparently re PEG feeding.   Denies any obvious fluctuation of symptoms with weather or environmental changes or other aggravating or alleviating factors except as outlined above    Review of Systems  Constitutional: Positive for appetite change and unexpected weight change. Negative for fever, chills and activity change.  HENT: Positive for sneezing and trouble swallowing. Negative for congestion, sore throat, rhinorrhea, dental problem, voice change and postnasal drip.   Eyes: Negative for visual disturbance.  Respiratory: Positive for cough and shortness of breath. Negative for choking.   Cardiovascular: Positive for chest pain. Negative for leg swelling.  Gastrointestinal: Positive for abdominal pain. Negative for nausea and vomiting.  Genitourinary: Negative for difficulty urinating.  Musculoskeletal: Positive for arthralgias.  Skin: Positive for rash.  Psychiatric/Behavioral: Negative for behavioral problems and confusion.         Objective:   Physical Exam  Elderly depressed withdrawn wm who sits stoically in w/c nad and lets fm answer most of his questions. Very congested rattling cough on cougn maneuver HEENT mild turbinate edema.  Oropharynx no thrush or excess pnd or cobblestoning.  No JVD or cervical adenopathy. Mild accessory muscle hypertrophy. Trachea midline, nl thryroid. Chest was mildly hyperinflated by percussion with diminished breath sounds and moderate increased exp time without wheeze. Hoover sign positive at end inspiration. Regular rate and rhythm without murmur gallop or rub or increase P2 or edema.  Abd: no hsm, nl excursion. Ext warm without cyanosis or clubbing.   .  cxr 10/31/11 Tiny right posterior pleural effusion versus thickening. Otherwise  clear lungs.       Assessment & Plan:

## 2012-01-25 NOTE — Patient Instructions (Signed)
Follow dietary restrictions  Start Perforomist 20 mcg twice daily with Budesonide 0.5 mg and just use albuterol 2.5 mg every 4 hours as needed  Flutter valve every hour prn cough  No pulmonary follow up needed

## 2012-01-25 NOTE — Procedures (Signed)
Modified Barium Swallow Procedure Note Patient Details  Name: Clayton Gordon MRN: [id number] Date of Birth: 22-Apr-1915  Today's Date: 01/25/2012 Time:  -     Past Medical History:  Past Medical History  Diagnosis Date  . CAD (coronary artery disease)     s/p CABG  . Asthma   . PAF (paroxysmal atrial fibrillation)   . Bradycardia   . HTN (hypertension)   . HLD (hyperlipidemia)   . AAA (abdominal aortic aneurysm)   . Orthostatic hypotension   . Dysphagia   . Gastroesophageal reflux disease   . CHF (congestive heart failure)   . Macular degeneration   . Heart murmur   . Myocardial infarction 1989  . Pneumonia     "3 times 2010"  . Chronic diarrhea   . Blood transfusion   . COPD (chronic obstructive pulmonary disease)   . Heart attack   . Hearing loss   . Nasal congestion   . Trouble swallowing   . Visual disturbance   . Cough   . Wheezing   . Chest pain   . Leg swelling   . Palpitations   . Constipation   . Diarrhea   . Arthritis pain   . Weakness    Past Surgical History:  Past Surgical History  Procedure Date  . Pacemaker insertion 2/02    Medtronic Sigma  . Inguinal hernia repair     right  . Abdominal aortic aneurysm repair 2012  . Back surgery   . Insert / replace / remove pacemaker   . Cataract extraction w/ intraocular lens  implant, bilateral   . Fixation kyphoplasty lumbar spine "March/April2011"  . Coronary artery bypass graft 1996    CABG X5  . Percutaneous endoleak repair 10/19/11   HPI:  Pt is a 78 year old male arriving ofr an outpatient MBS due to reports at SNF and from primary SLP that pt shows signs of aspiration with thin liquids and most solid consistencies. Wife is present and reports difficulty has increased over the past 6 months and that pt has also declined medically over that time. Pt has had two previous MBS with the last in 2011 recommending regular diet with thin liquids and hard cough following swallow. PMH: CAD, A-fib, GERD,  hyperlipidemia, diastolic CHF, dysphagia on and off thickened liquids.      Recommendation/Prognosis  Clinical Impression Dysphagia Diagnosis: Moderate oral phase dysphagia;Moderate pharyngeal phase dysphagia Clinical impression: Pt presents with worsened swallow function since last MBS with increased in sensory deficits. Pt with significant tongue pumping and difficutly masticating and transiting solid bolus. Pt with significant delay to pyriform sinuses with all liquid consistencies with silent aspiration as pooled residue fall to airway as pt initates swallow with thin and nectar thick liquids. Pt unable to complete second swallow to clear residuals. Of note, after initial aspiraiton event pt with frquent hard coughing though no penetration aspiraiton was occurring. Pt suspected to be severely deconditioned at this time, restrictive puree diet with honey thick liquids recommended to decrease high aspiration risk. Pt would benefit from SLP services at SNF for compensatroy strategies - small sips, second swallow.  Swallow Evaluation Recommendations Solid Consistency: Dysphagia 1 (Puree) Liquid Consistency: Honey Liquid Administration via: Cup Medication Administration: Crushed with puree Supervision: Full supervision/cueing for compensatory strategies Compensations: Slow rate;Small sips/bites;Multiple dry swallows after each bite/sip Postural Changes and/or Swallow Maneuvers: Seated upright 90 degrees;Upright 30-60 min after meal Follow up Recommendations: Skilled Nursing facility Prognosis Prognosis for Safe Diet Advancement: Fair  Individuals Consulted Consulted and Agree with Results and Recommendations: Family member/caregiver;Patient Family Member Consulted: wife   General:  HPI: Pt is a 78 year old male arriving ofr an outpatient MBS due to reports at SNF and from primary SLP that pt shows signs of aspiration with thin liquids and most solid consistencies. Wife is present and reports  difficulty has increased over the past 6 months and that pt has also declined medically over that time. Pt has had two previous MBS with the last in 2011 recommending regular diet with thin liquids and hard cough following swallow. PMH: CAD, A-fib, GERD, hyperlipidemia, diastolic CHF, dysphagia on and off thickened liquids.  Type of Study: Repeat MBS Diet Prior to this Study: Dysphagia 1 (puree);Nectar-thick liquids Respiratory Status: Room air Behavior/Cognition: Lethargic;Requires cueing;Doesn't follow directions;Cooperative Oral Cavity - Dentition: Adequate natural dentition (partials) Oral Motor / Sensory Function: Within functional limits Vision: Functional for self-feeding Patient Positioning: Upright in chair Baseline Vocal Quality: Clear Volitional Cough: Congested (frequent coughing at baseline) Volitional Swallow: Able to elicit   Oral Phase Oral Preparation/Oral Phase Oral Phase: Impaired Oral - Honey Oral - Honey Cup:  (Poor oral contaiment of bolus) Oral - Nectar Oral - Nectar Cup:  (Poor oral contaiment of bolus) Oral - Thin Oral - Thin Cup:  (Poor oral contaiment of bolus) Oral - Solids Oral - Puree: Weak lingual manipulation;Delayed oral transit Oral - Mechanical Soft: Impaired mastication;Weak lingual manipulation;Lingual pumping;Reduced posterior propulsion;Lingual/palatal residue;Delayed oral transit Pharyngeal Phase  Pharyngeal Phase Pharyngeal Phase: Impaired Pharyngeal - Honey Pharyngeal - Honey Cup: Premature spillage to pyriform;Pharyngeal residue - valleculae;Pharyngeal residue - pyriform Pharyngeal - Nectar Pharyngeal - Nectar Cup: Premature spillage to pyriform;Penetration/Aspiration before swallow;Penetration/Aspiration during swallow;Pharyngeal residue - valleculae Penetration/Aspiration details (nectar cup): Material enters airway, passes BELOW cords without attempt by patient to eject out (silent aspiration) Pharyngeal - Thin Pharyngeal - Thin Cup:  Premature spillage to pyriform;Penetration/Aspiration before swallow;Penetration/Aspiration during swallow;Pharyngeal residue - valleculae Penetration/Aspiration details (thin cup): Material enters airway, passes BELOW cords without attempt by patient to eject out (silent aspiration) Pharyngeal - Solids Pharyngeal - Puree: Premature spillage to pyriform;Pharyngeal residue - valleculae Pharyngeal - Mechanical Soft: Premature spillage to valleculae;Pharyngeal residue - valleculae Esophageal - sweep revealed mild stasis throughout esophagus with solid POs.  Harlon Ditty, [place] CCC-SLP [phone]   Clayton Gordon 01/25/2012, 12:52 PM

## 2012-01-26 DIAGNOSIS — I509 Heart failure, unspecified: Secondary | ICD-10-CM | POA: Insufficient documentation

## 2012-01-26 NOTE — Assessment & Plan Note (Signed)
Echo 11/01/11 reviewed with LAE = RAE so likely also has element of cor pulmonale

## 2012-01-26 NOTE — Assessment & Plan Note (Signed)
DDX of  difficult airways managment all start with A and  include Adherence, Ace Inhibitors, Acid Reflux, Active Sinus Disease, Alpha 1 Antitripsin deficiency, Anxiety masquerading as Airways dz,  ABPA,  allergy(esp in young), Aspiration (esp in elderly), Adverse effects of DPI,  Active smokers, plus two Bs  = Bronchiectasis and Beta blocker use..and one C= CHF  Aspiration appears to be the greatest concern here, discussed separately  Chf problematic with LAE > RAE and PAH therefore likely combined but more L Ht Failure than cor pulmonale > rx with 02 at hs and prn reasonable  Adverse effect of advair > try off and replace with neb formoterol and budesonide  Add CPPD and flutter valve as well

## 2012-01-26 NOTE — Assessment & Plan Note (Addendum)
Fm ready to focus on comfort rx per [place] but pt requesting "everything" per conversation 01/23/2012  This is a very tough situation but I assured the patient and family we would do "everything" - everything that is that is likely the benefit the patient - the only issue for the patient is whether he is willing to live longer at that expense of being npo, and even there is no guarantee he won't have a complication of tube feeding or peg placement  Or aspiration of oral contents non-food in nature.  What he appears to have is extremely poor cough mechanics (copd plus geriatric decline) combined with intermittent aspiration and there's no way to correct it - bronchoscopy would risk intubation/ mechanical vent and on the up side would only be a very temporary fix here.  I did rec CPPD, flutter valve, and max nebs.  No further abx or steroids indicated at this point though if he has a clear exac could be treated as aecopd as in the past.

## 2012-01-29 ENCOUNTER — Ambulatory Visit: Payer: Medicare Other | Admitting: Surgery

## 2012-01-30 ENCOUNTER — Other Ambulatory Visit (HOSPITAL_COMMUNITY): Payer: Medicare Other

## 2012-01-30 ENCOUNTER — Ambulatory Visit (HOSPITAL_COMMUNITY): Payer: Medicare Other

## 2012-02-12 DEATH — deceased

## 2012-04-02 ENCOUNTER — Ambulatory Visit: Payer: Self-pay | Admitting: Cardiology

## 2012-04-02 DIAGNOSIS — I4891 Unspecified atrial fibrillation: Secondary | ICD-10-CM

## 2012-05-27 ENCOUNTER — Ambulatory Visit: Payer: Medicare Other | Admitting: Surgery

## 2013-11-21 NOTE — Telephone Encounter (Signed)
No other info °
